# Patient Record
Sex: Female | Born: 1935 | Race: White | Hispanic: No | Smoking: Never smoker
Health system: Southern US, Community
[De-identification: ages and names within clinical notes are randomized; demographics above are authoritative.]

## PROBLEM LIST (undated history)

## (undated) DIAGNOSIS — J449 Chronic obstructive pulmonary disease, unspecified: Secondary | ICD-10-CM

## (undated) DIAGNOSIS — I509 Heart failure, unspecified: Secondary | ICD-10-CM

## (undated) DIAGNOSIS — I10 Essential (primary) hypertension: Secondary | ICD-10-CM

## (undated) HISTORY — PX: ABDOMINAL HYSTERECTOMY: SHX81

---

## 2005-11-21 ENCOUNTER — Ambulatory Visit: Payer: Self-pay

## 2006-12-06 ENCOUNTER — Ambulatory Visit: Payer: Self-pay

## 2008-03-19 ENCOUNTER — Ambulatory Visit: Payer: Self-pay

## 2008-04-04 ENCOUNTER — Ambulatory Visit: Payer: Self-pay

## 2008-04-09 ENCOUNTER — Ambulatory Visit: Payer: Self-pay

## 2009-07-06 ENCOUNTER — Ambulatory Visit: Payer: Self-pay

## 2010-03-03 ENCOUNTER — Inpatient Hospital Stay: Payer: Self-pay | Admitting: Internal Medicine

## 2010-08-30 ENCOUNTER — Ambulatory Visit: Payer: Self-pay | Admitting: Specialist

## 2010-09-21 ENCOUNTER — Ambulatory Visit: Payer: Self-pay

## 2011-10-27 ENCOUNTER — Ambulatory Visit: Payer: Self-pay

## 2014-02-22 ENCOUNTER — Inpatient Hospital Stay: Payer: Self-pay | Admitting: Internal Medicine

## 2014-02-22 LAB — URINALYSIS, COMPLETE
Bilirubin,UR: NEGATIVE
Glucose,UR: NEGATIVE mg/dL (ref 0–75)
Hyaline Cast: 13
Ketone: NEGATIVE
Nitrite: NEGATIVE
PH: 5 (ref 4.5–8.0)
PROTEIN: NEGATIVE
RBC,UR: <1 /HPF (ref 0–5)
Specific Gravity: 1.008 (ref 1.003–1.030)

## 2014-02-22 LAB — PROTIME-INR
INR: 1.2
Prothrombin Time: 14.9 secs — ABNORMAL HIGH (ref 11.5–14.7)

## 2014-02-22 LAB — COMPREHENSIVE METABOLIC PANEL
ALK PHOS: 114 U/L
ALT: 761 U/L — AB (ref 12–78)
ANION GAP: 8 (ref 7–16)
Albumin: 3.3 g/dL — ABNORMAL LOW (ref 3.4–5.0)
BUN: 61 mg/dL — ABNORMAL HIGH (ref 7–18)
Bilirubin,Total: 1.8 mg/dL — ABNORMAL HIGH (ref 0.2–1.0)
CALCIUM: 9.1 mg/dL (ref 8.5–10.1)
CHLORIDE: 97 mmol/L — AB (ref 98–107)
CO2: 30 mmol/L (ref 21–32)
CREATININE: 1.39 mg/dL — AB (ref 0.60–1.30)
EGFR (African American): 41 — ABNORMAL LOW
EGFR (Non-African Amer.): 35 — ABNORMAL LOW
GLUCOSE: 122 mg/dL — AB (ref 65–99)
OSMOLALITY: 289 (ref 275–301)
POTASSIUM: 4.7 mmol/L (ref 3.5–5.1)
SGOT(AST): 351 U/L — ABNORMAL HIGH (ref 15–37)
SODIUM: 135 mmol/L — AB (ref 136–145)
Total Protein: 6.1 g/dL — ABNORMAL LOW (ref 6.4–8.2)

## 2014-02-22 LAB — TROPONIN I
TROPONIN-I: 0.11 ng/mL — AB
Troponin-I: 0.05 ng/mL
Troponin-I: 0.12 ng/mL — ABNORMAL HIGH

## 2014-02-22 LAB — CBC
HCT: 44 % (ref 35.0–47.0)
HGB: 13.4 g/dL (ref 12.0–16.0)
MCH: 27.1 pg (ref 26.0–34.0)
MCHC: 30.6 g/dL — ABNORMAL LOW (ref 32.0–36.0)
MCV: 89 fL (ref 80–100)
Platelet: 185 10*3/uL (ref 150–440)
RBC: 4.97 10*6/uL (ref 3.80–5.20)
RDW: 14.9 % — AB (ref 11.5–14.5)
WBC: 12.1 10*3/uL — AB (ref 3.6–11.0)

## 2014-02-22 LAB — PRO B NATRIURETIC PEPTIDE: B-Type Natriuretic Peptide: 17300 pg/mL — ABNORMAL HIGH (ref 0–450)

## 2014-02-22 LAB — D-DIMER(ARMC): D-DIMER: 387 ng/mL

## 2014-02-23 LAB — CBC WITH DIFFERENTIAL/PLATELET
BASOS ABS: 0 10*3/uL (ref 0.0–0.1)
Basophil %: 0.1 %
EOS PCT: 0 %
Eosinophil #: 0 10*3/uL (ref 0.0–0.7)
HCT: 42 % (ref 35.0–47.0)
HGB: 12.7 g/dL (ref 12.0–16.0)
Lymphocyte #: 0.6 10*3/uL — ABNORMAL LOW (ref 1.0–3.6)
Lymphocyte %: 6 %
MCH: 26.5 pg (ref 26.0–34.0)
MCHC: 30.2 g/dL — AB (ref 32.0–36.0)
MCV: 88 fL (ref 80–100)
MONOS PCT: 6.4 %
Monocyte #: 0.6 x10 3/mm (ref 0.2–0.9)
Neutrophil #: 8.6 10*3/uL — ABNORMAL HIGH (ref 1.4–6.5)
Neutrophil %: 87.5 %
Platelet: 126 10*3/uL — ABNORMAL LOW (ref 150–440)
RBC: 4.77 10*6/uL (ref 3.80–5.20)
RDW: 14.3 % (ref 11.5–14.5)
WBC: 9.8 10*3/uL (ref 3.6–11.0)

## 2014-02-23 LAB — BASIC METABOLIC PANEL
ANION GAP: 3 — AB (ref 7–16)
Anion Gap: 6 — ABNORMAL LOW (ref 7–16)
BUN: 48 mg/dL — ABNORMAL HIGH (ref 7–18)
BUN: 44 mg/dL — ABNORMAL HIGH (ref 7–18)
CO2: 39 mmol/L — AB (ref 21–32)
CREATININE: 1.13 mg/dL (ref 0.60–1.30)
Calcium, Total: 9.2 mg/dL (ref 8.5–10.1)
Calcium, Total: 9 mg/dL (ref 8.5–10.1)
Chloride: 96 mmol/L — ABNORMAL LOW (ref 98–107)
Chloride: 96 mmol/L — ABNORMAL LOW (ref 98–107)
Co2: 40 mmol/L (ref 21–32)
Creatinine: 1.13 mg/dL (ref 0.60–1.30)
EGFR (African American): 52 — ABNORMAL LOW
EGFR (African American): 52 — ABNORMAL LOW
EGFR (Non-African Amer.): 45 — ABNORMAL LOW
GFR CALC NON AF AMER: 45 — AB
GLUCOSE: 88 mg/dL (ref 65–99)
Glucose: 113 mg/dL — ABNORMAL HIGH (ref 65–99)
Osmolality: 290 (ref 275–301)
Osmolality: 293 (ref 275–301)
POTASSIUM: 4.8 mmol/L (ref 3.5–5.1)
Potassium: 5.1 mmol/L (ref 3.5–5.1)
Sodium: 139 mmol/L (ref 136–145)
Sodium: 141 mmol/L (ref 136–145)

## 2014-02-23 LAB — LIPID PANEL
Cholesterol: 123 mg/dL (ref 0–200)
HDL: 16 mg/dL — AB (ref 40–60)
Ldl Cholesterol, Calc: 90 mg/dL (ref 0–100)
TRIGLYCERIDES: 87 mg/dL (ref 0–200)
VLDL CHOLESTEROL, CALC: 17 mg/dL (ref 5–40)

## 2014-02-23 LAB — TROPONIN I
TROPONIN-I: 0.12 ng/mL — AB
TROPONIN-I: 0.11 ng/mL — AB

## 2014-02-23 LAB — MAGNESIUM: MAGNESIUM: 1.5 mg/dL — AB

## 2014-02-24 LAB — COMPREHENSIVE METABOLIC PANEL
ALK PHOS: 81 U/L
Albumin: 3 g/dL — ABNORMAL LOW (ref 3.4–5.0)
Anion Gap: 3 — ABNORMAL LOW (ref 7–16)
BILIRUBIN TOTAL: 0.8 mg/dL (ref 0.2–1.0)
BUN: 43 mg/dL — ABNORMAL HIGH (ref 7–18)
CALCIUM: 9.5 mg/dL (ref 8.5–10.1)
Chloride: 93 mmol/L — ABNORMAL LOW (ref 98–107)
Co2: 43 mmol/L (ref 21–32)
Creatinine: 1.26 mg/dL (ref 0.60–1.30)
EGFR (African American): 46 — ABNORMAL LOW
EGFR (Non-African Amer.): 40 — ABNORMAL LOW
Glucose: 122 mg/dL — ABNORMAL HIGH (ref 65–99)
OSMOLALITY: 290 (ref 275–301)
Potassium: 4.4 mmol/L (ref 3.5–5.1)
SGOT(AST): 70 U/L — ABNORMAL HIGH (ref 15–37)
SGPT (ALT): 362 U/L — ABNORMAL HIGH (ref 12–78)
Sodium: 139 mmol/L (ref 136–145)
TOTAL PROTEIN: 5.4 g/dL — AB (ref 6.4–8.2)

## 2014-02-24 LAB — MAGNESIUM: MAGNESIUM: 1.8 mg/dL

## 2014-02-27 LAB — CULTURE, BLOOD (SINGLE)

## 2015-02-14 NOTE — Discharge Summary (Signed)
PATIENT NAME:  Priscilla Powers, MCWATTERS MR#:  [id number] DATE OF BIRTH:  [birth year]  DATE OF ADMISSION:  02/22/2014 DATE OF DISCHARGE:  02/24/2014  ADMISSION DIAGNOSES: Acute respiratory failure.   DISCHARGE DIAGNOSES:  1. Acute on chronic respiratory failure secondary to acute chronic obstructive pulmonary disease exacerbation and congestive heart failure.  2. Acute diastolic heart failure.  3. Elevated liver function tests.  4. Urinary tract infection.  5. Pneumonia.  6. Chronic obstructive pulmonary disease acute exacerbation on chronic.   CONSULTATIONS: None.   RADIOLOGICAL DATA:  A 2-D echocardiogram showed an EF of greater than 75% with restrictive pattern of LV diastolic feeling with moderately enlarged right ventricle, severely dilated right atrium, mild mitral valve regurgitation.   CT chest showed no evidence of pulmonary emboli.   LABORATORIES AT DISCHARGE: Discharge sodium 139, potassium 4.4, chloride 93, BUN 43, creatinine 1.26, glucose is 122, bilirubin 0.8, ALT 362, AST 70. Total protein 5.4, albumin 3.0, bilirubin 0.8.   HOSPITAL COURSE: A very pleasant 81 year old female presented with acute respiratory failure. For further details, please refer to the H and P.  1. Acute on chronic respiratory failure from right heart strain with diastolic dysfunction from underlying COPD and CHF exacerbation. The patient was placed on oxygen here. She had been on oxygen in the past at home. She was also placed on BiPAP here and failed BiPAP therapy for her COPD exacerbation. She was also started on antibiotics for a possible underlying pneumonia as per the chest x-ray and chest CT that was performed. She has greatly improved and will need oxygen at discharge.  2. Acute diastolic heart failure.  The patient was started on IV Lasix. She will continue with Lasix as an outpatient; however, it should be noted that we did diurese her and she has a metabolic alkalosis from the Lasix and we will hold Lasix for  a few days and restart this on Friday. Her echo showed hyperdynamic EF with a restrictive pattern. The patient needs an outpatient followup for her blood pressure and COPD. Elevated LFTs, which had improved. These LFTs are secondary to acute CHF exacerbation.  3. Urinary tract infection. The patient is on Levaquin.  4. Pneumonia, community-acquired pneumonia. The patient was on Levaquin.  5. COPD, acute exacerbation. The patient was placed on IV steroids, weaned to p.o. steroids and oxygen. She was on oxygen years ago but for a short time. The patient has acute exacerbation of COPD.  BiPAP not effective in hospital. Patient required noninvasive ventilator at home; therefore, we will recommend Trilogy as an outpatient.   DISCHARGE MEDICATIONS:  1. Inderal 60 mg daily.  2. Acetaminophen 500 mg 2 tablets q.6 hours.  3. Omeprazole 20 mg daily.  4. B12 3000 mcg daily.  5. Lasix 20 mg b.i.d. Please restart on 02/28/2014.  6. Prednisone taper starting at 50 mg, taper by 10 mg every 3 days.  7. Levaquin 750 mg q. 48 hours x 6 hours.  8. Advair 250/50 b.i.d.  9. Tiotropium 18 mcg daily.   DISCHARGE HOME HEALTH: With physical therapy, nurse, and nurse aide.   DISCHARGE DIET: Low sodium.   DISCHARGE OXYGEN: Two liters. The patient will also be discharged with a Trilogy noninvasive machine.   DISCHARGE ACTIVITY: No exertion or heavy lifting.   DISCHARGE FOLLOWUP:  Patient will follow up with Dr. Meredeth Ide in 1 week and Dr. Adrian Blackwater in 1 week and Dr. Vear Clock in 1 week.   TIME SPENT: Approximately 38 minutes on this discharge.  The patient is medically stable for discharge. Plan of care was discussed with the family.    ____________________________ Janyth ContesSital P. Juliene PinaMody, MD spm:dd D: 02/24/2014 13:58:00 ET T: 02/24/2014 23:31:50 ET JOB#: [id number]  cc: Laurier NancyShaukat A. Khan, MD Marcine Matarharles W. Phillips Jr., MD Laiba Fuerte P. Juliene PinaMody, MD, <Dictator> Dr. Merry LoftyFleming     Hayly Litsey P Pavel Gadd MD ELECTRONICALLY SIGNED 02/25/2014  13:13

## 2015-02-14 NOTE — H&P (Signed)
PATIENT NAME:  Priscilla Powers, Priscilla S MR#:  [id number] DATE OF BIRTH:  1930-04-30  DATE OF ADMISSION:  02/22/2014  PRIMARY CARE PHYSICIAN:  Dr. Vear ClockPhillips.   CHIEF COMPLAINT:  Leg swelling and shortness of breath.   HISTORY OF PRESENT ILLNESS:  This is an 81 year old female who has a history of hypertension, B12 deficiency, COPD.  She states that her legs have been more swollen for about a week.  Recently increased the dosage of the fluid pill.  She has been falling asleep a lot.  No energy.  Normally she walks with a walker.  She has had some shortness of breath and trouble lying flat without being short of breath.  She has had no appetite and she is very nauseated.  In the ER, she had a pulse ox of 76% on room air.  An ABG showed CO2 retention.  The patient was placed on BiPAP.  She had an elevated BNP.  Hospitalist services were contacted for further evaluation.   PAST MEDICAL HISTORY:  Hypertension, B12 deficiency, COPD, gastroesophageal reflux disease.   PAST SURGICAL HISTORY:  Hysterectomy, hip surgery, knee surgery and a breast gland biopsy.   ALLERGIES:  PENICILLIN.   MEDICATIONS:  As per prescription writer include acetaminophen 500 mg 2 tablets every six hours, B12 3000 mcg once a day, Inderal 60 mg daily, Lasix 20 mg twice a day, omeprazole 20 mg daily.   SOCIAL HISTORY:  Quit smoking 30 years ago.  Used to work in a [employer].  Lives with her daughter.  No alcohol or drug use.   FAMILY HISTORY:  Mother died of car accident.  Father died of lung cancer.   REVIEW OF SYSTEMS:  CONSTITUTIONAL:  Positive for chills.  Positive for cold feeling.  Positive for weakness.  No fever.  Poor appetite.  EYES:  She does wear reading glasses.  EARS, NOSE, MOUTH AND THROAT:  Decreased hearing.  Positive for runny nose.  Positive for dysphagia to solids.  CARDIOVASCULAR:  No chest pain.  No palpitations.  RESPIRATORY:  Positive for shortness of breath.  Positive for cough, clear phlegm.  No hemoptysis.   GASTROINTESTINAL:  Positive for nausea.  No vomiting.  Occasional abdominal pain.  On and off diarrhea.  No bright red blood per rectum.  No melena.  GENITOURINARY:  No burning on urination or hematuria.  MUSCULOSKELETAL:  Positive for joint pain.  INTEGUMENT:  No rashes or eruptions.  NEUROLOGIC:  Falling asleep easily.  No passing out.  PSYCHIATRIC:  No anxiety or depression.  ENDOCRINE:  No thyroid problems.  HEMATOLOGIC AND LYMPHATIC:  No anemia.   PHYSICAL EXAMINATION: VITAL SIGNS:  Temperature 95.9, pulse 65, respirations 18, blood pressure 113/53.  Pulse ox 76% on room air.  GENERAL:  Slight respiratory distress, using accessory muscles.  EYES:  Conjunctivae and lids normal.  Pupils equal, round, and reactive to light.  Extraocular muscles intact.  No nystagmus.   EARS, NOSE, MOUTH AND THROAT:  Tympanic membranes:  No erythema.  Nasal mucosa:  No erythema.  Throat:  No erythema.  No exudate seen.  Lips and gums:  No lesions.  NECK:  No JVD.  No bruits.  No lymphadenopathy.  No thyromegaly.  No thyroid nodules palpated.  RESPIRATORY:  Positive use of accessory muscles to breathe.  Positive rales bilateral bases.  Poor air entry bilaterally.  CARDIOVASCULAR:  S1 and S2, bradycardic.  No gallops, rubs, or murmurs heard.  Carotid upstroke 2+ bilaterally.  No bruits.  Dorsalis pedis  pulses are 1+ bilaterally, 3+ edema bilateral lower extremity.  ABDOMEN:  Soft, nontender.  No organomegaly/splenomegaly.  Normoactive bowel sounds.  No masses felt.  LYMPHATIC:  No lymph nodes in the neck.  MUSCULOSKELETAL:  3+ edema.  No cyanosis on BiPAP.  SKIN:  Purplish discoloration of all of her toes on her right foot and up the foot, cool feeling also.  NEUROLOGIC:  Cranial nerves II through XII grossly intact.  Deep tendon reflexes, 1+ bilateral lower extremity.  PSYCHIATRIC:  The patient is alert and oriented to person and place.   LABORATORY AND RADIOLOGICAL DATA:  Chest x-ray showed small area of  opacity of the left lung base, not well evaluated due to superimposed cardiac silhouette.  ABG showed a pH of 7.24, pCO2 of 88, pO2 155, bicarb 37.7, O2 saturation 99.4; that was on 28% oxygen.  Bilateral lower extremity ultrasound negative.  BNP 17,300.  Glucose 122, BUN 61, creatinine 1.39, sodium 135, potassium 4.7, chloride 97, CO2 30, calcium 9.1, total bilirubin 1.8, ALT 761, AST 351, albumin 3.3.  White blood cell count 12.1, hemoglobin and hematocrit 13.4 and 44, platelet count of 185.  INR 1.2.  Troponin negative.  D-dimer negative.   EKG:  Normal sinus rhythm, 63 beats per minute, left axis deviation, left atrial enlargement, pulmonary disease pattern, nonspecific ST-T wave changes.   ASSESSMENT AND PLAN: 1.  Acute respiratory failure, hypoxic and hypercarbic.  The patient currently on BiPAP in order to oxygenate and blow off CO2.  Will be a candidate for Trilogy as outpatient.  2.  Acute congestive heart failure with anasarca, likely systolic in nature with cardiomegaly seen on chest x-ray.  Intravenous Lasix given in the Emergency Room.  We will continue that on a daily basis.  We will switch the Inderal over to metoprolol.  Obtain an echocardiogram, likely will need an ACE inhibitor.  3.  Hypertension.  Blood pressure currently stable.  4.  Chronic obstructive pulmonary disease.  Looking back at old records, back in 2011 was sent home with home oxygen.  Currently not on oxygen at home.  We will have to check to see if a candidate for oxygen at home.  We will also treat for chronic obstructive pulmonary disease exacerbation with Levaquin and Solu-Medrol and DuoNeb nebulizer solution.  We will start on Spiriva and Advair.  5.  Increased liver function tests.  Could be secondary to congestive heart failure.  We will obtain a sonogram of the liver and check a hepatitis profile.  6.  B12 deficiency.  Continue B12.  7.  Gastroesophageal reflux disease, on a proton pump inhibitor.   Time spent on  admission 55 minutes.   CODE STATUS:  THE PATIENT IS A DO NOT RESUSCITATE.    ____________________________ Herschell Dimes. Renae Gloss, MD rjw:ea D: 02/22/2014 13:46:06 ET T: 02/22/2014 15:47:24 ET JOB#: [id number]  cc: Herschell Dimes. Renae Gloss, MD, <Dictator> Marcine Matar., MD Salley Scarlet MD ELECTRONICALLY SIGNED 02/22/2014 17:15

## 2015-05-12 ENCOUNTER — Encounter: Payer: Self-pay | Admitting: *Deleted

## 2015-05-12 DIAGNOSIS — N390 Urinary tract infection, site not specified: Secondary | ICD-10-CM | POA: Insufficient documentation

## 2015-05-12 DIAGNOSIS — I10 Essential (primary) hypertension: Secondary | ICD-10-CM | POA: Insufficient documentation

## 2015-05-12 DIAGNOSIS — M545 Low back pain: Secondary | ICD-10-CM | POA: Diagnosis not present

## 2015-05-12 DIAGNOSIS — Z880 Allergy status to penicillin: Secondary | ICD-10-CM | POA: Diagnosis not present

## 2015-05-12 NOTE — ED Notes (Signed)
Pt unable to void at this time. 

## 2015-05-12 NOTE — ED Notes (Addendum)
Pt to triage via wheelchair.  Pt has lower back pain.  Sx for 4 days.  No dysuria.  No known injury.  Pt states she injured her back 5 years ago and did not have surgery.  Pt states she chose to take pain medicine instead of surgery.  Pt is taking extra strength tyelenol for pain without relief now  Pt ambulates with a walker and states today she is unable to ambulate.

## 2015-05-13 ENCOUNTER — Emergency Department: Payer: Medicare Other

## 2015-05-13 ENCOUNTER — Emergency Department
Admission: EM | Admit: 2015-05-13 | Discharge: 2015-05-13 | Disposition: A | Discharge status: Home / Self Care | Payer: Medicare Other | Attending: Emergency Medicine | Admitting: Emergency Medicine

## 2015-05-13 DIAGNOSIS — M545 Low back pain, unspecified: Secondary | ICD-10-CM

## 2015-05-13 DIAGNOSIS — N390 Urinary tract infection, site not specified: Secondary | ICD-10-CM

## 2015-05-13 LAB — URINALYSIS COMPLETE WITH MICROSCOPIC (ARMC ONLY)
Bilirubin Urine: NEGATIVE
GLUCOSE, UA: NEGATIVE mg/dL
Hgb urine dipstick: NEGATIVE
Ketones, ur: NEGATIVE mg/dL
Nitrite: NEGATIVE
PH: 5 (ref 5.0–8.0)
Protein, ur: NEGATIVE mg/dL
Specific Gravity, Urine: 1.014 (ref 1.005–1.030)

## 2015-05-13 LAB — CBC WITH DIFFERENTIAL/PLATELET
BASOS ABS: 0 10*3/uL (ref 0–0.1)
Basophils Relative: 1 %
EOS PCT: 2 %
Eosinophils Absolute: 0.2 10*3/uL (ref 0–0.7)
HEMATOCRIT: 34.5 % — AB (ref 35.0–47.0)
HEMOGLOBIN: 11.4 g/dL — AB (ref 12.0–16.0)
Lymphocytes Relative: 16 %
Lymphs Abs: 1.2 10*3/uL (ref 1.0–3.6)
MCH: 29.9 pg (ref 26.0–34.0)
MCHC: 33 g/dL (ref 32.0–36.0)
MCV: 90.6 fL (ref 80.0–100.0)
Monocytes Absolute: 0.9 10*3/uL (ref 0.2–0.9)
Monocytes Relative: 11 %
Neutro Abs: 5.6 10*3/uL (ref 1.4–6.5)
Neutrophils Relative %: 70 %
Platelets: 170 10*3/uL (ref 150–440)
RBC: 3.81 MIL/uL (ref 3.80–5.20)
RDW: 15.5 % — AB (ref 11.5–14.5)
WBC: 7.9 10*3/uL (ref 3.6–11.0)

## 2015-05-13 LAB — BASIC METABOLIC PANEL
Anion gap: 5 (ref 5–15)
BUN: 26 mg/dL — ABNORMAL HIGH (ref 6–20)
CO2: 31 mmol/L (ref 22–32)
CREATININE: 1.37 mg/dL — AB (ref 0.44–1.00)
Calcium: 10 mg/dL (ref 8.9–10.3)
Chloride: 104 mmol/L (ref 101–111)
GFR calc Af Amer: 40 mL/min — ABNORMAL LOW (ref >60)
GFR calc non Af Amer: 35 mL/min — ABNORMAL LOW (ref >60)
Glucose, Bld: 114 mg/dL — ABNORMAL HIGH (ref 65–99)
POTASSIUM: 4.2 mmol/L (ref 3.5–5.1)
Sodium: 140 mmol/L (ref 135–145)

## 2015-05-13 MED ORDER — TRAMADOL HCL 50 MG PO TABS
25.0000 mg | ORAL_TABLET | Freq: Once | ORAL | Status: AC
  Administered 2015-05-13: 25 mg via ORAL
  Filled 2015-05-13: qty 1

## 2015-05-13 MED ORDER — TRAMADOL HCL 50 MG PO TABS: 25.0000 mg | ORAL_TABLET | Freq: Four times a day (QID) | ORAL | Status: DC | PRN

## 2015-05-13 MED ORDER — NITROFURANTOIN MONOHYD MACRO 100 MG PO CAPS
100.0000 mg | ORAL_CAPSULE | Freq: Once | ORAL | Status: AC
  Administered 2015-05-13: 100 mg via ORAL
  Filled 2015-05-13: qty 1

## 2015-05-13 MED ORDER — NITROFURANTOIN MONOHYD MACRO 100 MG PO CAPS: 100.0000 mg | ORAL_CAPSULE | Freq: Two times a day (BID) | ORAL | Status: DC

## 2015-05-13 MED ORDER — KETOROLAC TROMETHAMINE 30 MG/ML IJ SOLN
30.0000 mg | Freq: Once | INTRAMUSCULAR | Status: AC
  Administered 2015-05-13: 30 mg via INTRAVENOUS
  Filled 2015-05-13: qty 1

## 2015-05-13 MED ORDER — HYDROCODONE-ACETAMINOPHEN 5-325 MG PO TABS: 1.0000 | ORAL_TABLET | Freq: Once | ORAL | Status: DC

## 2015-05-13 NOTE — ED Notes (Signed)
Ambulation trial performed per MD request. Patient able to ambulate around the room several times with the use of her walker. Urinae sample collected and sent while patient was OOB. No verbalized needs at this time. Will continue to monitor.

## 2015-05-13 NOTE — ED Provider Notes (Signed)
[facility] Emergency Department Provider Note  ____________________________________________  Time seen: Approximately 12:40 AM  I have reviewed the triage vital signs and the nursing notes.   HISTORY  Chief Complaint Back Pain    HPI Priscilla Powers is a 81 y.o. female who presents to the ED from home with a chief complaint of low back pain 4 days. Patient denies trauma, fall or injury. Patient describes aching to lower back which increases with movement. Unable to ambulate with her walker today secondary to pain. Patient has been taking extra strength Tylenol without relief. States she injured her back 5 years ago; had an MRI and was offered surgery. Patient chose nonsurgical treatment and has no had any issues with her back since. Denies fever, chills, chest pain, shortness of breath, cough, abdominal pain, vomiting, diarrhea, dysuria, weakness, numbness, tingling, bladder or bowel incontinence. Patient complains of urinary frequency but was started on Lasix last week for lower leg swelling.    PAST MEDICAL HISTORY: Hypertension, B12 deficiency, COPD, gastroesophageal reflux disease.   PAST SURGICAL HISTORY: Hysterectomy, hip surgery, knee surgery and a breast gland biopsy.   There are no active problems to display for this patient.    No current outpatient prescriptions on file.  Allergies Penicillins  FAMILY HISTORY: Mother died of car accident. Father died of lung cancer.   Social History History  Substance Use Topics  . Smoking status: Never Smoker   . Smokeless tobacco: Not on file  . Alcohol Use: No  Former smoker  Review of Systems Constitutional: No fever/chills Eyes: No visual changes. ENT: No sore throat. Cardiovascular: Denies chest pain. Respiratory: Denies shortness of breath. Gastrointestinal: No abdominal pain.  No nausea, no vomiting.  No diarrhea.  No constipation. Genitourinary: Positive for frequency.Negative for  dysuria. Musculoskeletal: Positive for back pain. Skin: Negative for rash. Neurological: Negative for headaches, focal weakness or numbness.  10-point ROS otherwise negative.  ____________________________________________   PHYSICAL EXAM:  VITAL SIGNS: ED Triage Vitals  Enc Vitals Group     BP 05/12/15 2200 112/59 mmHg     Pulse Rate 05/12/15 2200 83     Resp 05/12/15 2200 20     Temp 05/12/15 2200 97.9 F (36.6 C)     Temp Source 05/12/15 2200 Oral     SpO2 05/12/15 2200 98 %     Weight 05/12/15 2200 137 lb (62.143 kg)     Height 05/12/15 2200 5\' 5"  (1.651 m)     Head Cir --      Peak Flow --      Pain Score 05/12/15 2202 7     Pain Loc --      Pain Edu? --      Excl. in GC? --     Constitutional: Alert and oriented. Well appearing and in no acute distress. Eyes: Conjunctivae are normal. PERRL. EOMI. Head: Atraumatic. Nose: No congestion/rhinnorhea. Mouth/Throat: Mucous membranes are moist.  Oropharynx non-erythematous. Neck: No stridor.   Cardiovascular: Normal rate, regular rhythm. Grossly normal heart sounds.  Good peripheral circulation. Respiratory: Normal respiratory effort.  No retractions. Lungs CTAB. Gastrointestinal: Soft and nontender. No distention. No abdominal bruits. No CVA tenderness. Musculoskeletal: Mild tenderness to palpation lumbar spine. Limited ROM secondary to pain. No lower extremity tenderness.1+ pitting BLE edema.  No joint effusions. Neurologic:  Normal speech and language. No gross focal neurologic deficits are appreciated.  Skin:  Skin is warm, dry and intact. No rash noted. Psychiatric: Mood and affect are normal. Speech  and behavior are normal.  ____________________________________________   LABS (all labs ordered are listed, but only abnormal results are displayed)  Labs Reviewed  CBC WITH DIFFERENTIAL/PLATELET - Abnormal; Notable for the following:    Hemoglobin 11.4 (*)    HCT 34.5 (*)    RDW 15.5 (*)    All other components  within normal limits  BASIC METABOLIC PANEL - Abnormal; Notable for the following:    Glucose, Bld 114 (*)    BUN 26 (*)    Creatinine, Ser 1.37 (*)    GFR calc non Af Amer 35 (*)    GFR calc Af Amer 40 (*)    All other components within normal limits  URINALYSIS COMPLETEWITH MICROSCOPIC ([facility] ONLY) - Abnormal; Notable for the following:    Color, Urine YELLOW (*)    APPearance CLEAR (*)    Leukocytes, UA 1+ (*)    Bacteria, UA RARE (*)    Squamous Epithelial / LPF 0-5 (*)    All other components within normal limits   ____________________________________________  EKG  None  ____________________________________________  RADIOLOGY  Lumbar spine x-rays (viewed by me, interpreted by Dr. Cherly Hensen): 1. No evidence of acute fracture or subluxation along the lumbar spine. 2. Significant chronic compression deformity of vertebral body L1, worsened from prior studies. ____________________________________________   PROCEDURES  Procedure(s) performed: None  Critical Care performed: No  ____________________________________________   INITIAL IMPRESSION / ASSESSMENT AND PLAN / ED COURSE  Pertinent labs & imaging results that were available during my care of the patient were reviewed by me and considered in my medical decision making (see chart for details).  81 year old female with nontraumatic lumbar back pain. Will obtain x-ray, urinalysis, check screening laboratory work, IV analgesia and reassess.  ----------------------------------------- 4:11 AM on 05/13/2015 -----------------------------------------  Patient ambulated well with a walker. Discussed with patient and family member; both are comfortable with patient going home with mild analgesia. Will follow-up with orthopedics. Strict return precautions given. Both verbalize understanding and agree with plan of care. ____________________________________________   FINAL CLINICAL IMPRESSION(S) / ED DIAGNOSES  Final  diagnoses:  Midline low back pain without sciatica  UTI (lower urinary tract infection)      Irean Hong, MD 05/13/15 [phone]

## 2015-05-13 NOTE — ED Notes (Signed)
Patient transported to X-ray 

## 2015-05-13 NOTE — Discharge Instructions (Signed)
1. Take antibiotics as prescribed (Macrobid 100 mg twice daily 5 days). 2. Take one half tramadol tablet every 6 hours as needed for back pain (#15). 3. Return to the ER for worsening symptoms, fever, persistent vomiting, weakness in legs, bladder or bowel incontinence or other concerns.  Back Pain, Adult Low back pain is very common. About 1 in 5 people have back pain.The cause of low back pain is rarely dangerous. The pain often gets better over time.About half of people with a sudden onset of back pain feel better in just 2 weeks. About 8 in 10 people feel better by 6 weeks.  CAUSES Some common causes of back pain include:  Strain of the muscles or ligaments supporting the spine.  Wear and tear (degeneration) of the spinal discs.  Arthritis.  Direct injury to the back. DIAGNOSIS Most of the time, the direct cause of low back pain is not known.However, back pain can be treated effectively even when the exact cause of the pain is unknown.Answering your caregiver's questions about your overall health and symptoms is one of the most accurate ways to make sure the cause of your pain is not dangerous. If your caregiver needs more information, he or she may order lab work or imaging tests (X-rays or MRIs).However, even if imaging tests show changes in your back, this usually does not require surgery. HOME CARE INSTRUCTIONS For many people, back pain returns.Since low back pain is rarely dangerous, it is often a condition that people can learn to [facility] their own.   Remain active. It is stressful on the back to sit or stand in one place. Do not sit, drive, or stand in one place for more than 30 minutes at a time. Take short walks on level surfaces as soon as pain allows.Try to increase the length of time you walk each day.  Do not stay in bed.Resting more than 1 or 2 days can delay your recovery.  Do not avoid exercise or work.Your body is made to move.It is not dangerous to be  active, even though your back may hurt.Your back will likely heal faster if you return to being active before your pain is gone.  Pay attention to your body when you bend and lift. Many people have less discomfortwhen lifting if they bend their knees, keep the load close to their bodies,and avoid twisting. Often, the most comfortable positions are those that put less stress on your recovering back.  Find a comfortable position to sleep. Use a firm mattress and lie on your side with your knees slightly bent. If you lie on your back, put a pillow under your knees.  Only take over-the-counter or prescription medicines as directed by your caregiver. Over-the-counter medicines to reduce pain and inflammation are often the most helpful.Your caregiver may prescribe muscle relaxant drugs.These medicines help dull your pain so you can more quickly return to your normal activities and healthy exercise.  Put ice on the injured area.  Put ice in a plastic bag.  Place a towel between your skin and the bag.  Leave the ice on for 15-20 minutes, 03-04 times a day for the first 2 to 3 days. After that, ice and heat may be alternated to reduce pain and spasms.  Ask your caregiver about trying back exercises and gentle massage. This may be of some benefit.  Avoid feeling anxious or stressed.Stress increases muscle tension and can worsen back pain.It is important to recognize when you are anxious or stressed and  learn ways to manage it.Exercise is a great option. SEEK MEDICAL CARE IF:  You have pain that is not relieved with rest or medicine.  You have pain that does not improve in 1 week.  You have new symptoms.  You are generally not feeling well. SEEK IMMEDIATE MEDICAL CARE IF:   You have pain that radiates from your back into your legs.  You develop new bowel or bladder control problems.  You have unusual weakness or numbness in your arms or legs.  You develop nausea or vomiting.  You  develop abdominal pain.  You feel faint. Document Released: 10/10/2005 Document Revised: 04/10/2012 Document Reviewed: 02/11/2014 [facility] Patient Information 2015 [place], [place]. This information is not intended to replace advice given to you by your health care provider. Make sure you discuss any questions you have with your health care provider.  Urinary Tract Infection Urinary tract infections (UTIs) can develop anywhere along your urinary tract. Your urinary tract is your body's drainage system for removing wastes and extra water. Your urinary tract includes two kidneys, two ureters, a bladder, and a urethra. Your kidneys are a pair of bean-shaped organs. Each kidney is about the size of your fist. They are located below your ribs, one on each side of your spine. CAUSES Infections are caused by microbes, which are microscopic organisms, including fungi, viruses, and bacteria. These organisms are so small that they can only be seen through a microscope. Bacteria are the microbes that most commonly cause UTIs. SYMPTOMS  Symptoms of UTIs may vary by age and gender of the patient and by the location of the infection. Symptoms in young women typically include a frequent and intense urge to urinate and a painful, burning feeling in the bladder or urethra during urination. Older women and men are more likely to be tired, shaky, and weak and have muscle aches and abdominal pain. A fever may mean the infection is in your kidneys. Other symptoms of a kidney infection include pain in your back or sides below the ribs, nausea, and vomiting. DIAGNOSIS To diagnose a UTI, your caregiver will ask you about your symptoms. Your caregiver also will ask to provide a urine sample. The urine sample will be tested for bacteria and white blood cells. White blood cells are made by your body to help fight infection. TREATMENT  Typically, UTIs can be treated with medication. Because most UTIs are caused by a bacterial  infection, they usually can be treated with the use of antibiotics. The choice of antibiotic and length of treatment depend on your symptoms and the type of bacteria causing your infection. HOME CARE INSTRUCTIONS  If you were prescribed antibiotics, take them exactly as your caregiver instructs you. Finish the medication even if you feel better after you have only taken some of the medication.  Drink enough water and fluids to keep your urine clear or pale yellow.  Avoid caffeine, tea, and carbonated beverages. They tend to irritate your bladder.  Empty your bladder often. Avoid holding urine for long periods of time.  Empty your bladder before and after sexual intercourse.  After a bowel movement, women should cleanse from front to back. Use each tissue only once. SEEK MEDICAL CARE IF:   You have back pain.  You develop a fever.  Your symptoms do not begin to resolve within 3 days. SEEK IMMEDIATE MEDICAL CARE IF:   You have severe back pain or lower abdominal pain.  You develop chills.  You have nausea or vomiting.  You have continued burning or discomfort with urination. MAKE SURE YOU:   Understand these instructions.  Will watch your condition.  Will get help right away if you are not doing well or get worse. Document Released: 07/20/2005 Document Revised: 04/10/2012 Document Reviewed: 11/18/2011 [facility] Patient Information 2015 [place], [place]. This information is not intended to replace advice given to you by your health care provider. Make sure you discuss any questions you have with your health care provider.

## 2015-05-15 ENCOUNTER — Emergency Department
Admission: EM | Admit: 2015-05-15 | Discharge: 2015-05-15 | Disposition: A | Discharge status: Home / Self Care | Payer: Medicare Other | Attending: Emergency Medicine | Admitting: Emergency Medicine

## 2015-05-15 DIAGNOSIS — M545 Low back pain: Secondary | ICD-10-CM | POA: Insufficient documentation

## 2015-05-15 DIAGNOSIS — Z8781 Personal history of (healed) traumatic fracture: Secondary | ICD-10-CM | POA: Insufficient documentation

## 2015-05-15 DIAGNOSIS — R531 Weakness: Secondary | ICD-10-CM | POA: Insufficient documentation

## 2015-05-15 DIAGNOSIS — F419 Anxiety disorder, unspecified: Secondary | ICD-10-CM | POA: Insufficient documentation

## 2015-05-15 DIAGNOSIS — Z880 Allergy status to penicillin: Secondary | ICD-10-CM | POA: Diagnosis not present

## 2015-05-15 DIAGNOSIS — M549 Dorsalgia, unspecified: Secondary | ICD-10-CM

## 2015-05-15 DIAGNOSIS — G8929 Other chronic pain: Secondary | ICD-10-CM | POA: Diagnosis not present

## 2015-05-15 DIAGNOSIS — Z79899 Other long term (current) drug therapy: Secondary | ICD-10-CM | POA: Diagnosis not present

## 2015-05-15 LAB — CBC
HEMATOCRIT: 33.5 % — AB (ref 35.0–47.0)
HEMOGLOBIN: 11.2 g/dL — AB (ref 12.0–16.0)
MCH: 30.1 pg (ref 26.0–34.0)
MCHC: 33.3 g/dL (ref 32.0–36.0)
MCV: 90.4 fL (ref 80.0–100.0)
Platelets: 161 10*3/uL (ref 150–440)
RBC: 3.71 MIL/uL — AB (ref 3.80–5.20)
RDW: 15.2 % — ABNORMAL HIGH (ref 11.5–14.5)
WBC: 10 10*3/uL (ref 3.6–11.0)

## 2015-05-15 LAB — COMPREHENSIVE METABOLIC PANEL
ALBUMIN: 3.6 g/dL (ref 3.5–5.0)
ALT: 12 U/L — ABNORMAL LOW (ref 14–54)
AST: 21 U/L (ref 15–41)
Alkaline Phosphatase: 93 U/L (ref 38–126)
Anion gap: 6 (ref 5–15)
BUN: 25 mg/dL — AB (ref 6–20)
CO2: 30 mmol/L (ref 22–32)
CREATININE: 1.24 mg/dL — AB (ref 0.44–1.00)
Calcium: 10.5 mg/dL — ABNORMAL HIGH (ref 8.9–10.3)
Chloride: 105 mmol/L (ref 101–111)
GFR calc Af Amer: 45 mL/min — ABNORMAL LOW (ref >60)
GFR, EST NON AFRICAN AMERICAN: 39 mL/min — AB (ref >60)
Glucose, Bld: 146 mg/dL — ABNORMAL HIGH (ref 65–99)
Potassium: 4.4 mmol/L (ref 3.5–5.1)
Sodium: 141 mmol/L (ref 135–145)
Total Bilirubin: 1.3 mg/dL — ABNORMAL HIGH (ref 0.3–1.2)
Total Protein: 6.2 g/dL — ABNORMAL LOW (ref 6.5–8.1)

## 2015-05-15 MED ORDER — OXYCODONE-ACETAMINOPHEN 5-325 MG PO TABS: 1.0000 | ORAL_TABLET | Freq: Three times a day (TID) | ORAL | Status: DC | PRN

## 2015-05-15 MED ORDER — OXYCODONE-ACETAMINOPHEN 5-325 MG PO TABS
1.0000 | ORAL_TABLET | Freq: Once | ORAL | Status: AC
  Administered 2015-05-15: 1 via ORAL
  Filled 2015-05-15: qty 1

## 2015-05-15 NOTE — ED Notes (Signed)
Pt son in law at bedside; states that pt is unable to function while at home. States that pt needs to know alternatives and ways to help her at home. Pt lives with son in law and daughter but they have jobs and are unable to provide 24/7 care for patient.

## 2015-05-15 NOTE — Discharge Instructions (Signed)

## 2015-05-15 NOTE — ED Notes (Signed)
Pt here with family states she has a hx of compression fx and recently having increased pain nausea and is not able to get up and move around without a lot of assistance, states she is taking pain medication without relief.Marland Kitchen

## 2015-05-15 NOTE — ED Provider Notes (Signed)
[facility] Emergency Department Provider Note  ____________________________________________  Time seen: On arrival  I have reviewed the triage vital signs and the nursing notes.   HISTORY  Chief Complaint Back Pain and Weakness    HPI Priscilla Powers is a 81 y.o. female with a history of an L1 compression fracture who presents with low back pain. She was seen in the emergency department 2 days ago where x-rays demonstrated worsening of her chronic L1 compression fracture. She was given pain medication and was able to ambulate with her walker. Over the last 2 days she has not had relief of her pain despite taking Vicodin intermittently. She is having difficulty getting around. She denies focal weakness or numbness. No saddle anesthesia. No fevers no chills     No past medical history on file.  There are no active problems to display for this patient.   No past surgical history on file.  Current Outpatient Rx  Name  Route  Sig  Dispense  Refill  . nitrofurantoin, macrocrystal-monohydrate, (MACROBID) 100 MG capsule   Oral   Take 1 capsule (100 mg total) by mouth 2 (two) times daily.   10 capsule   0   . traMADol (ULTRAM) 50 MG tablet   Oral   Take 0.5 tablets (25 mg total) by mouth every 6 (six) hours as needed for moderate pain.   15 tablet   0     Allergies Penicillins  No family history on file.  Social History History  Substance Use Topics  . Smoking status: Never Smoker   . Smokeless tobacco: Not on file  . Alcohol Use: No    Review of Systems  Constitutional: Negative for fever. Eyes: Negative for visual changes. ENT: Negative for sore throat Cardiovascular: Negative for chest pain. Respiratory: Negative for shortness of breath. Gastrointestinal: Negative for abdominal pain, vomiting and diarrhea. Genitourinary: Negative for dysuria. Musculoskeletal: Positive for back pain Skin: Negative for rash. Neurological: Negative for  headaches or focal weakness Psychiatric:Mild anxiety  10-point ROS otherwise negative.  ____________________________________________   PHYSICAL EXAM:  VITAL SIGNS: ED Triage Vitals  Enc Vitals Group     BP 05/15/15 0858 139/63 mmHg     Pulse Rate 05/15/15 0858 88     Resp 05/15/15 0858 24     Temp 05/15/15 0858 98 F (36.7 C)     Temp Source 05/15/15 0858 Oral     SpO2 05/15/15 0858 90 %     Weight 05/15/15 0858 136 lb (61.689 kg)     Height 05/15/15 0858  (1.651 m)     Head Cir --      Peak Flow --      Pain Score 05/15/15 0859 10     Pain Loc --      Pain Edu? --      Excl. in GC? --      Constitutional: Alert and oriented. Well appearing and in no distress. Eyes: Conjunctivae are normal.  ENT   Head: Normocephalic and atraumatic.   Mouth/Throat: Mucous membranes are moist. Cardiovascular: Normal rate, regular rhythm. Normal and symmetric distal pulses are present in all extremities. No murmurs, rubs, or gallops. Respiratory: Normal respiratory effort without tachypnea nor retractions. Breath sounds are clear and equal bilaterally.  Gastrointestinal: Soft and non-tender in all quadrants. No distention. There is no CVA tenderness. Genitourinary: deferred Musculoskeletal: Nontender with normal range of motion in all extremities. No lower extremity tenderness nor edema. No vertebral point tenderness tolpation  Neurologic:  Normal speech and language. No known neurological deficits are appreciated. Normal reflexes in the lower extremity is Skin:  Skin is warm, dry. To left lower back patient has mild erythema she attributes to a heating pad Psychiatric: Mood and affect are normal. Patient exhibits appropriate insight and judgment.  ____________________________________________    LABS (pertinent positives/negatives)  Labs Reviewed - No data to  display  ____________________________________________   EKG  None  ____________________________________________    RADIOLOGY I have personally reviewed any xrays that were ordered on this patient: None  ____________________________________________   PROCEDURES  Procedure(s) performed: none  Critical Care performed: none  ____________________________________________   INITIAL IMPRESSION / ASSESSMENT AND PLAN / ED COURSE  Pertinent labs & imaging results that were available during my care of the patient were reviewed by me and considered in my medical decision making (see chart for details).  We will give patient by mouth analgesic here to see if we get her pain controlled.  1 percocet significantly improved patients pain and she is moving around easily now. Her labs are unremarkable. I see no reason for admission given that patient is moving and ambulating well. I spoke with her daughter on the phone who would prefer the patient be admitted and is unhappy about discharge but I feel discharge is appropriate at this time and asked the patient to follow up with her pcp for discussions re: Assisted living/home health.  ____________________________________________   FINAL CLINICAL IMPRESSION(S) / ED DIAGNOSES  Final diagnoses:  Chronic back pain     Jene Every, MD 05/15/15 1455

## 2015-05-15 NOTE — ED Notes (Addendum)
Dr Cyril Loosen spoke with patient daughter and informed of discharge instructions. Pt dressed and awaiting ride at this time  Pt able to ambulate with assistance, uses walker at home.

## 2015-05-15 NOTE — ED Notes (Signed)
MD at bedside. 

## 2015-05-15 NOTE — ED Notes (Signed)
Pt alert and oriented X4, active, cooperative, pt in NAD. RR even and unlabored, color WNL.  Pt informed to return if any life threatening symptoms occur.   

## 2015-05-15 NOTE — ED Notes (Addendum)
Chronic lower back pain, increasing pain over the last few days. Pt has been applying heating pad, reddened areas to lower back. Fluid in bilateral lower ankles, pt states that Dr Welton Flakes put her on fluid pill. Blister to right lower leg that appears to have popped. Pt has extreme pain in lower back with movement. Pt states she is currently being treated for UTI. Pt was seen in ER X 2 days ago for lower back pain, has been taking Tylenol ES at home with no relief. Pt states she has tried taking Vicodin, does not help with pain.

## 2015-06-05 ENCOUNTER — Emergency Department: Payer: Medicare Other

## 2015-06-05 ENCOUNTER — Encounter: Payer: Self-pay | Admitting: Emergency Medicine

## 2015-06-05 ENCOUNTER — Inpatient Hospital Stay
Admission: EM | Admit: 2015-06-05 | Discharge: 2015-06-13 | DRG: 870 | Disposition: A | Discharge status: Hospice | Payer: Medicare Other | Attending: Internal Medicine | Admitting: Internal Medicine

## 2015-06-05 DIAGNOSIS — Z66 Do not resuscitate: Secondary | ICD-10-CM | POA: Diagnosis present

## 2015-06-05 DIAGNOSIS — E870 Hyperosmolality and hypernatremia: Secondary | ICD-10-CM | POA: Diagnosis present

## 2015-06-05 DIAGNOSIS — J449 Chronic obstructive pulmonary disease, unspecified: Secondary | ICD-10-CM | POA: Diagnosis not present

## 2015-06-05 DIAGNOSIS — I10 Essential (primary) hypertension: Secondary | ICD-10-CM | POA: Diagnosis present

## 2015-06-05 DIAGNOSIS — L03115 Cellulitis of right lower limb: Secondary | ICD-10-CM | POA: Diagnosis present

## 2015-06-05 DIAGNOSIS — A4102 Sepsis due to Methicillin resistant Staphylococcus aureus: Secondary | ICD-10-CM | POA: Diagnosis present

## 2015-06-05 DIAGNOSIS — Z8249 Family history of ischemic heart disease and other diseases of the circulatory system: Secondary | ICD-10-CM | POA: Diagnosis not present

## 2015-06-05 DIAGNOSIS — D649 Anemia, unspecified: Secondary | ICD-10-CM | POA: Diagnosis present

## 2015-06-05 DIAGNOSIS — Z87891 Personal history of nicotine dependence: Secondary | ICD-10-CM

## 2015-06-05 DIAGNOSIS — A419 Sepsis, unspecified organism: Secondary | ICD-10-CM | POA: Diagnosis not present

## 2015-06-05 DIAGNOSIS — J9602 Acute respiratory failure with hypercapnia: Secondary | ICD-10-CM | POA: Diagnosis not present

## 2015-06-05 DIAGNOSIS — N179 Acute kidney failure, unspecified: Secondary | ICD-10-CM | POA: Diagnosis present

## 2015-06-05 DIAGNOSIS — J441 Chronic obstructive pulmonary disease with (acute) exacerbation: Secondary | ICD-10-CM | POA: Diagnosis present

## 2015-06-05 DIAGNOSIS — R1310 Dysphagia, unspecified: Secondary | ICD-10-CM | POA: Diagnosis present

## 2015-06-05 DIAGNOSIS — L03116 Cellulitis of left lower limb: Secondary | ICD-10-CM | POA: Diagnosis present

## 2015-06-05 DIAGNOSIS — Z515 Encounter for palliative care: Secondary | ICD-10-CM

## 2015-06-05 DIAGNOSIS — J9622 Acute and chronic respiratory failure with hypercapnia: Secondary | ICD-10-CM | POA: Diagnosis present

## 2015-06-05 DIAGNOSIS — M549 Dorsalgia, unspecified: Secondary | ICD-10-CM | POA: Diagnosis present

## 2015-06-05 DIAGNOSIS — J15212 Pneumonia due to Methicillin resistant Staphylococcus aureus: Secondary | ICD-10-CM | POA: Diagnosis present

## 2015-06-05 DIAGNOSIS — R0602 Shortness of breath: Secondary | ICD-10-CM

## 2015-06-05 DIAGNOSIS — R911 Solitary pulmonary nodule: Secondary | ICD-10-CM | POA: Diagnosis present

## 2015-06-05 DIAGNOSIS — R251 Tremor, unspecified: Secondary | ICD-10-CM | POA: Diagnosis present

## 2015-06-05 DIAGNOSIS — I959 Hypotension, unspecified: Secondary | ICD-10-CM | POA: Diagnosis present

## 2015-06-05 DIAGNOSIS — E872 Acidosis: Secondary | ICD-10-CM | POA: Diagnosis present

## 2015-06-05 DIAGNOSIS — R64 Cachexia: Secondary | ICD-10-CM | POA: Diagnosis present

## 2015-06-05 DIAGNOSIS — I5033 Acute on chronic diastolic (congestive) heart failure: Secondary | ICD-10-CM | POA: Diagnosis present

## 2015-06-05 DIAGNOSIS — J9621 Acute and chronic respiratory failure with hypoxia: Secondary | ICD-10-CM | POA: Insufficient documentation

## 2015-06-05 DIAGNOSIS — I4891 Unspecified atrial fibrillation: Secondary | ICD-10-CM | POA: Diagnosis present

## 2015-06-05 DIAGNOSIS — G9341 Metabolic encephalopathy: Secondary | ICD-10-CM | POA: Diagnosis present

## 2015-06-05 DIAGNOSIS — E43 Unspecified severe protein-calorie malnutrition: Secondary | ICD-10-CM | POA: Diagnosis present

## 2015-06-05 DIAGNOSIS — N289 Disorder of kidney and ureter, unspecified: Secondary | ICD-10-CM

## 2015-06-05 DIAGNOSIS — J9600 Acute respiratory failure, unspecified whether with hypoxia or hypercapnia: Secondary | ICD-10-CM

## 2015-06-05 HISTORY — DX: Essential (primary) hypertension: I10

## 2015-06-05 HISTORY — DX: Heart failure, unspecified: I50.9

## 2015-06-05 HISTORY — DX: Chronic obstructive pulmonary disease, unspecified: J44.9

## 2015-06-05 LAB — URINALYSIS COMPLETE WITH MICROSCOPIC (ARMC ONLY)
BACTERIA UA: NONE SEEN
Bilirubin Urine: NEGATIVE
GLUCOSE, UA: NEGATIVE mg/dL
Hgb urine dipstick: NEGATIVE
Ketones, ur: NEGATIVE mg/dL
LEUKOCYTES UA: NEGATIVE
Nitrite: NEGATIVE
PROTEIN: NEGATIVE mg/dL
RBC / HPF: NONE SEEN RBC/hpf (ref 0–5)
Specific Gravity, Urine: 1.011 (ref 1.005–1.030)
WBC UA: NONE SEEN WBC/hpf (ref 0–5)
pH: 5 (ref 5.0–8.0)

## 2015-06-05 LAB — BLOOD GAS, ARTERIAL
ACID-BASE EXCESS: 10.1 mmol/L — AB (ref 0.0–3.0)
ALLENS TEST (PASS/FAIL): POSITIVE — AB
Acid-Base Excess: 7.7 mmol/L — ABNORMAL HIGH (ref 0.0–3.0)
Allens test (pass/fail): POSITIVE — AB
BICARBONATE: 43.3 meq/L — AB (ref 21.0–28.0)
Bicarbonate: 31 mEq/L — ABNORMAL HIGH (ref 21.0–28.0)
FIO2: 0.28
FIO2: 0.5
LHR: 16 {breaths}/min
MECHANICAL RATE: 16
MECHVT: 450 mL
O2 Saturation: 95.7 %
O2 Saturation: 99.8 %
PATIENT TEMPERATURE: 37
PCO2 ART: 116 mmHg — AB (ref 32.0–48.0)
PEEP/CPAP: 5 cmH2O
PH ART: 7.18 — AB (ref 7.350–7.450)
PH ART: 7.52 — AB (ref 7.350–7.450)
PO2 ART: 225 mmHg — AB (ref 83.0–108.0)
Patient temperature: 37
pCO2 arterial: 38 mmHg (ref 32.0–48.0)
pO2, Arterial: 97 mmHg (ref 83.0–108.0)

## 2015-06-05 LAB — COMPREHENSIVE METABOLIC PANEL
ALBUMIN: 3.6 g/dL (ref 3.5–5.0)
ALT: 18 U/L (ref 14–54)
AST: 16 U/L (ref 15–41)
Alkaline Phosphatase: 106 U/L (ref 38–126)
Anion gap: 7 (ref 5–15)
BUN: 57 mg/dL — ABNORMAL HIGH (ref 6–20)
CALCIUM: 13.3 mg/dL — AB (ref 8.9–10.3)
CO2: 34 mmol/L — AB (ref 22–32)
CREATININE: 1.59 mg/dL — AB (ref 0.44–1.00)
Chloride: 107 mmol/L (ref 101–111)
GFR calc Af Amer: 34 mL/min — ABNORMAL LOW (ref >60)
GFR, EST NON AFRICAN AMERICAN: 29 mL/min — AB (ref >60)
Glucose, Bld: 105 mg/dL — ABNORMAL HIGH (ref 65–99)
Potassium: 5 mmol/L (ref 3.5–5.1)
SODIUM: 148 mmol/L — AB (ref 135–145)
Total Bilirubin: 1.3 mg/dL — ABNORMAL HIGH (ref 0.3–1.2)
Total Protein: 6.5 g/dL (ref 6.5–8.1)

## 2015-06-05 LAB — CBC WITH DIFFERENTIAL/PLATELET
BASOS ABS: 0 10*3/uL (ref 0–0.1)
BASOS PCT: 0 %
Eosinophils Absolute: 0 10*3/uL (ref 0–0.7)
Eosinophils Relative: 0 %
HEMATOCRIT: 46 % (ref 35.0–47.0)
Hemoglobin: 14.5 g/dL (ref 12.0–16.0)
LYMPHS PCT: 8 %
Lymphs Abs: 1.2 10*3/uL (ref 1.0–3.6)
MCH: 29.7 pg (ref 26.0–34.0)
MCHC: 31.4 g/dL — ABNORMAL LOW (ref 32.0–36.0)
MCV: 94.5 fL (ref 80.0–100.0)
MONO ABS: 1 10*3/uL — AB (ref 0.2–0.9)
Monocytes Relative: 7 %
NEUTROS ABS: 11.9 10*3/uL — AB (ref 1.4–6.5)
Neutrophils Relative %: 85 %
Platelets: 181 10*3/uL (ref 150–440)
RBC: 4.87 MIL/uL (ref 3.80–5.20)
RDW: 18.2 % — ABNORMAL HIGH (ref 11.5–14.5)
WBC: 14 10*3/uL — AB (ref 3.6–11.0)

## 2015-06-05 LAB — LACTIC ACID, PLASMA
LACTIC ACID, VENOUS: 1.5 mmol/L (ref 0.5–2.0)
LACTIC ACID, VENOUS: 1.1 mmol/L (ref 0.5–2.0)

## 2015-06-05 LAB — TROPONIN I: TROPONIN I: 0.04 ng/mL — AB (ref <0.031)

## 2015-06-05 MED ORDER — ACETAMINOPHEN 325 MG PO TABS
650.0000 mg | ORAL_TABLET | Freq: Four times a day (QID) | ORAL | Status: DC | PRN
  Administered 2015-06-09 – 2015-06-10 (×3): 650 mg via ORAL
  Filled 2015-06-05 (×3): qty 2

## 2015-06-05 MED ORDER — OXYCODONE HCL 5 MG PO TABS: 5.0000 mg | ORAL_TABLET | ORAL | Status: DC | PRN

## 2015-06-05 MED ORDER — ACETAMINOPHEN 650 MG RE SUPP
650.0000 mg | Freq: Four times a day (QID) | RECTAL | Status: DC | PRN
  Administered 2015-06-10: 650 mg via RECTAL
  Filled 2015-06-05 (×2): qty 1

## 2015-06-05 MED ORDER — IPRATROPIUM-ALBUTEROL 0.5-2.5 (3) MG/3ML IN SOLN
3.0000 mL | RESPIRATORY_TRACT | Status: DC
  Administered 2015-06-05 – 2015-06-06 (×3): 3 mL via RESPIRATORY_TRACT
  Filled 2015-06-05 (×3): qty 3

## 2015-06-05 MED ORDER — HEPARIN SODIUM (PORCINE) 5000 UNIT/ML IJ SOLN
5000.0000 [IU] | Freq: Three times a day (TID) | INTRAMUSCULAR | Status: DC
  Administered 2015-06-05 – 2015-06-08 (×9): 5000 [IU] via SUBCUTANEOUS
  Filled 2015-06-05 (×9): qty 1

## 2015-06-05 MED ORDER — SODIUM CHLORIDE 0.9 % IV SOLN
INTRAVENOUS | Status: DC
  Administered 2015-06-06 – 2015-06-08 (×2): via INTRAVENOUS

## 2015-06-05 MED ORDER — MIDAZOLAM HCL 5 MG/5ML IJ SOLN
2.0000 mg | Freq: Once | INTRAMUSCULAR | Status: AC
  Administered 2015-06-05: 2 mg via INTRAVENOUS

## 2015-06-05 MED ORDER — SUCCINYLCHOLINE CHLORIDE 20 MG/ML IJ SOLN
INTRAMUSCULAR | Status: AC | PRN
  Administered 2015-06-05: 50 mg via INTRAVENOUS

## 2015-06-05 MED ORDER — SODIUM CHLORIDE 0.9 % IV SOLN
1.0000 mg/h | INTRAVENOUS | Status: DC
  Administered 2015-06-05: 0.5 mg/h via INTRAVENOUS
  Administered 2015-06-06 (×2): 2 mg/h via INTRAVENOUS
  Filled 2015-06-05 (×2): qty 10

## 2015-06-05 MED ORDER — MORPHINE SULFATE 2 MG/ML IJ SOLN: 2.0000 mg | INTRAMUSCULAR | Status: DC | PRN

## 2015-06-05 MED ORDER — ONDANSETRON HCL 4 MG/2ML IJ SOLN: 4.0000 mg | Freq: Four times a day (QID) | INTRAMUSCULAR | Status: DC | PRN

## 2015-06-05 MED ORDER — ONDANSETRON HCL 4 MG PO TABS: 4.0000 mg | ORAL_TABLET | Freq: Four times a day (QID) | ORAL | Status: DC | PRN

## 2015-06-05 MED ORDER — SODIUM CHLORIDE 0.9 % IV SOLN
Freq: Once | INTRAVENOUS | Status: AC
  Administered 2015-06-05: 22:00:00 via INTRAVENOUS

## 2015-06-05 MED ORDER — VANCOMYCIN HCL IN DEXTROSE 1-5 GM/200ML-% IV SOLN
1000.0000 mg | Freq: Once | INTRAVENOUS | Status: AC
  Administered 2015-06-05: 1000 mg via INTRAVENOUS
  Filled 2015-06-05: qty 200

## 2015-06-05 MED ORDER — SODIUM CHLORIDE 0.9 % IV BOLUS (SEPSIS)
1000.0000 mL | Freq: Once | INTRAVENOUS | Status: AC
  Administered 2015-06-05: 1000 mL via INTRAVENOUS

## 2015-06-05 MED ORDER — VANCOMYCIN HCL IN DEXTROSE 1-5 GM/200ML-% IV SOLN: 1000.0000 mg | Freq: Once | INTRAVENOUS | Status: DC

## 2015-06-05 MED ORDER — MIDAZOLAM HCL 2 MG/2ML IJ SOLN
2.0000 mg | Freq: Once | INTRAMUSCULAR | Status: AC
  Administered 2015-06-05: 2 mg via INTRAVENOUS

## 2015-06-05 MED ORDER — ETOMIDATE 2 MG/ML IV SOLN
INTRAVENOUS | Status: AC | PRN
  Administered 2015-06-05: 30 mg via INTRAVENOUS

## 2015-06-05 MED ORDER — SODIUM CHLORIDE 0.9 % IJ SOLN
3.0000 mL | Freq: Two times a day (BID) | INTRAMUSCULAR | Status: DC
  Administered 2015-06-06 – 2015-06-09 (×7): 3 mL via INTRAVENOUS

## 2015-06-05 MED ORDER — FENTANYL 2500MCG IN NS 250ML (10MCG/ML) PREMIX INFUSION
10.0000 ug/h | INTRAVENOUS | Status: DC
Start: 2015-06-05 — End: 2015-06-07
  Administered 2015-06-05: 10 ug/h via INTRAVENOUS
  Administered 2015-06-06 – 2015-06-07 (×2): 150 ug/h via INTRAVENOUS
  Administered 2015-06-07: 100 ug/h via INTRAVENOUS
  Filled 2015-06-05 (×3): qty 250

## 2015-06-05 NOTE — ED Notes (Signed)
Intubated by Dr Ladona Ridgel [id number] size 7.5 21 at the lip

## 2015-06-05 NOTE — ED Notes (Signed)
Pt to rm 6 via EMS.  Per EMS, pt w/ altered mental status and weakness over past week, but worse starting this afternoon.  EMS report pt being tx for sores on leg.  Pt took first dose of PO levaquin today.  Pt alert but nonverbal upon arrival.

## 2015-06-05 NOTE — ED Provider Notes (Signed)
[facility] Emergency Department Provider Note ____________________________________________  Time seen: Approximately 8:29 PM  I have reviewed the triage vital signs and the nursing notes.   HISTORY  Chief Complaint Altered Mental Status and Weakness  HPI Priscilla Powers is a 81 y.o. female corresponding by EMS from home for altered mental status. The history we got is that the patient has just had progressive worsening altered mental status and weakness. Patient was currently recently started on Levaquin for questionable cellulitis of her bilateral legs. Family had stated that her mental status she's got much worse today. On arrival EMS they noted that the patient was definitely lethargic, her mucous membranes were dry, she had redness of her bilateral lower extremities with open sores and lesions, but she will follow some commands though she did not respond much verbally. Patient could not answer to questions of the severity of her illnesses quality context or any modifying factors, she just could not answer any other questions secondary to her condition.  Past Medical History  Diagnosis Date  . COPD (chronic obstructive pulmonary disease)   . CHF (congestive heart failure)   . Hypertension     There are no active problems to display for this patient.   Past Surgical History  Procedure Laterality Date  . Abdominal hysterectomy      Current Outpatient Rx  Name  Route  Sig  Dispense  Refill  . ADVAIR DISKUS 250-50 MCG/DOSE AEPB   Inhalation   Inhale 1 puff into the lungs every 12 (twelve) hours.      0     Dispense as written.   . furosemide (LASIX) 40 MG tablet   Oral   Take 1 tablet by mouth 2 (two) times daily.      0   . levofloxacin (LEVAQUIN) 750 MG tablet   Oral   Take 750 mg by mouth daily.         . potassium chloride (K-DUR) 10 MEQ tablet   Oral   Take 1 tablet by mouth daily.      0   . propranolol (INDERAL) 40 MG tablet    Oral   Take 1 tablet by mouth daily.      0   . SPIRIVA HANDIHALER 18 MCG inhalation capsule   Oral   Take 1 capsule by mouth daily.      1     Dispense as written.   Marland Kitchen spironolactone (ALDACTONE) 25 MG tablet   Oral   Take 1 tablet by mouth daily.      0   . nitrofurantoin, macrocrystal-monohydrate, (MACROBID) 100 MG capsule   Oral   Take 1 capsule (100 mg total) by mouth 2 (two) times daily.   10 capsule   0   . oxyCODONE-acetaminophen (ROXICET) 5-325 MG per tablet   Oral   Take 1 tablet by mouth every 8 (eight) hours as needed. Patient not taking: Reported on 06/05/2015   30 tablet   0     Allergies Penicillins  History reviewed. No pertinent family history.  Social History Social History  Substance Use Topics  . Smoking status: Never Smoker   . Smokeless tobacco: None  . Alcohol Use: No    Review of Systems I am unable to perform an adequate review of systems secondary to the patient's condition and altered mental status. 10-point ROS otherwise negative.  ____________________________________________   PHYSICAL EXAM:  VITAL SIGNS: ED Triage Vitals  Enc Vitals Group  BP --      Pulse --      Resp --      Temp --      Temp src --      SpO2 --      Weight --      Height --      Head Cir --      Peak Flow --      Pain Score --      Pain Loc --      Pain Edu? --      Excl. in GC? --     Constitutional: Patient is mildly lethargic and oriented to at least her name. She does not appear to be oriented to place and time.. Patient in mild distress secondary to her tachypnea and her mucous membranes are dry. Eyes: Conjunctivae are normal. PERRL. EOMI. Head: Atraumatic. Nose: No congestion/rhinnorhea. Mouth/Throat: Mucous membranes are dry.  Oropharynx non-erythematous. Neck: No stridor.   Cardiovascular: Normal rate, regular rhythm. Grossly normal heart sounds.  Good peripheral circulation. Respiratory: Patient is mildly tachypneic.Marland Kitchen  Patient  with mild retractions. Lungs CTAB. Gastrointestinal: Soft and nontender. No distention. No abdominal bruits. No CVA tenderness. Musculoskeletal: Patient with 2+ pitting edema to her bilateral lower extremities the right lower extremity greater than the left. She has extensive redness to the anterior portions of both tib fibs including the foot on the right as well as her tib-fib on the right, with several ulcerative weeping lesions on her right leg and right foot. Patient does have palpable pulses that are weak bilaterally.  No joint effusions. Neurologic:  Normal speech and language. No gross focal neurologic deficits are appreciated. No gait instability. Skin:  . No rash noted. Patient with weeping lesions to her bilateral lower extremities but the right side greater than the left. Psychiatric: Patient appears to be pleasant but she does not respond much verbally except for occasional yes or no. If you ask her to smile she will smile but she does not follow many other commands secondary to the shear fact that she does not feel well. ____________________________________________   LABS (all labs ordered are listed, but only abnormal results are displayed)  Labs Reviewed  CBC WITH DIFFERENTIAL/PLATELET - Abnormal; Notable for the following:    WBC 14.0 (*)    MCHC 31.4 (*)    RDW 18.2 (*)    Neutro Abs 11.9 (*)    Monocytes Absolute 1.0 (*)    All other components within normal limits  BLOOD GAS, ARTERIAL - Abnormal; Notable for the following:    pH, Arterial 7.18 (*)    pCO2 arterial 116 (*)    Bicarbonate 43.3 (*)    Acid-Base Excess 10.1 (*)    Allens test (pass/fail) POSITIVE (*)    All other components within normal limits  TROPONIN I - Abnormal; Notable for the following:    Troponin I 0.04 (*)    All other components within normal limits  COMPREHENSIVE METABOLIC PANEL - Abnormal; Notable for the following:    Sodium 148 (*)    CO2 34 (*)    Glucose, Bld 105 (*)    BUN 57 (*)     Creatinine, Ser 1.59 (*)    Calcium 13.3 (*)    Total Bilirubin 1.3 (*)    GFR calc non Af Amer 29 (*)    GFR calc Af Amer 34 (*)    All other components within normal limits  CULTURE, BLOOD (ROUTINE X 2)  CULTURE,  BLOOD (ROUTINE X 2)  WOUND CULTURE  ANAEROBIC CULTURE  LACTIC ACID, PLASMA  URINALYSIS COMPLETEWITH MICROSCOPIC ([facility] ONLY)  LACTIC ACID, PLASMA  BLOOD GAS, ARTERIAL   ____________________________________________  EKG  ED ECG REPORT I, Leona Carry, the attending physician, personally viewed and interpreted this ECG.   Date: 06/05/2015  EKG Time: 2030  Rate: 79  Rhythm: Normal sinus rhythm with occasional PACs and atrial pacing.  Axis: Left axis deviation  Intervals:left anterior fascicular block  ST&T Change: Nonspecific ST and T-wave changes and patient has LVH with repolarization abnormality  ____________________________________________  RADIOLOGY Dg Chest 1 View  06/05/2015   CLINICAL DATA:  Altered mental status. Weakness. Shortness of breath.  EXAM: CHEST  1 VIEW  COMPARISON:  One-view chest x-ray 02/22/2014.  FINDINGS: Cardiomegaly is again noted. Lung volumes are low. This is a reversed lordotic view. No focal airspace disease is evident. The visualized soft tissues and bony thorax are unremarkable.  IMPRESSION: 1. Stable cardiomegaly without failure.   Electronically Signed   By: Marin Roberts M.D.   On: 06/05/2015 21:22   Dg Lumbar Spine Complete  05/13/2015   CLINICAL DATA:  Acute onset of lower back pain.  Initial encounter.  EXAM: LUMBAR SPINE - COMPLETE 4+ VIEW  COMPARISON:  Lumbar spine radiographs and CT performed 03/03/2010, and lumbar spine MRI performed 03/04/2010  FINDINGS: There is no evidence of acute fracture or subluxation. There appears to be significant chronic compression deformity of vertebral body L1, worsened from prior studies. Vertebral bodies demonstrate normal alignment. Mild disc space narrowing is noted at L5-S1.  The  visualized bowel gas pattern is unremarkable in appearance; air and stool are noted within the colon. The sacroiliac joints are within normal limits. Scattered vascular calcifications are seen.  IMPRESSION: 1. No evidence of acute fracture or subluxation along the lumbar spine. 2. Significant chronic compression deformity of vertebral body L1, worsened from prior studies.   Electronically Signed   By: Roanna Raider M.D.   On: 05/13/2015 02:23    ____________________________________________   PROCEDURES  Procedure(s) performed: See procedure note   .INTUBATION Performed by: Leona Carry  Required items: required blood products, implants, devices, and special equipment available Patient identity confirmed: provided demographic data and hospital-assigned identification number Time out: Immediately prior to procedure a "time out" was called to verify the correct patient, procedure, equipment, support staff and site/side marked as required.  Indications: Acute respiratory failure   Intubation method: No regular physician   Preoxygenation: Bag-valve-mask   Sedatives: 20 mg Etomidate Paralytic: 50 mg Succinylcholine  Tube Size: 7.5 cuffed  Post-procedure assessment: chest rise and ETCO2 monitor Breath sounds: equal and absent over the epigastrium Tube secured with: ETT holder Chest x-ray interpreted by radiologist and me.  Chest x-ray findings: Yes endotracheal tube in appropriate position  Patient tolerated the procedure well with no immediate complications.    Critical Care performed: Yes, see critical care note(s) CRITICAL CARE Performed by: Leona Carry   Total critical care time: 30-74 minutes Critical care time was exclusive of separately billable procedures and treating other patients.  Critical care was necessary to treat or prevent imminent or life-threatening deterioration.  Critical care was time spent personally by me on the following activities:  development of treatment plan with patient and/or surrogate as well as nursing, discussions with consultants, evaluation of patient's response to treatment, examination of patient, obtaining history from patient or surrogate, ordering and performing treatments and interventions, ordering and  review of laboratory studies, ordering and review of radiographic studies, pulse oximetry and re-evaluation of patient's condition. Patient had to be intubated and placed on a ventilator as well. ____________________________________________   INITIAL IMPRESSION / ASSESSMENT AND PLAN / ED COURSE  Pertinent labs & imaging results that were available during my care of the patient were reviewed by me and considered in my medical decision making (see chart for details). ----------------------------------------- 8:39 PM on 06/05/2015 ----------------------------------------- Patient is going to have a rectal temp along with being started on IV fluids and we will get blood and wound cultures for her cellulitis. Patient will also be getting CT head for her altered mental status and chest x-ray. ____________________________________________ ----------------------------------------- 11:18 PM on 06/05/2015 -----------------------------------------  About 2150, patient got an ABG which showed acute respiratory failure. Patient was under moderate sedation intubated with a 7.5 mm ET tube. Portable chest x-ray for placement was showing that the tube was in good position. Patient tolerated the procedure well and was started on some IV vancomycin for her cellulitis. Patient had blood wound and urine culture sent. Dr. Dimas Aguas hospitalist is going to admit the patient.  FINAL CLINICAL IMPRESSION(S) / ED DIAGNOSES  Final diagnoses:  Acute respiratory failure, unspecified whether with hypoxia or hypercapnia  Sepsis, due to unspecified organism  Bilateral cellulitis of lower leg  Hypercalcemia  Renal insufficiency       Leona Carry, MD 06/05/15 2320

## 2015-06-05 NOTE — H&P (Signed)
[facility] - [place] at [facility]   PATIENT NAME: Priscilla Powers    MR#:  [id number]  DATE OF BIRTH:  [birth year]   DATE OF ADMISSION:  06/05/2015  PRIMARY CARE PHYSICIAN: No primary care provider on file.   REQUESTING/REFERRING PHYSICIAN: Suella Broad  CHIEF COMPLAINT:   Chief Complaint  Patient presents with  . Altered Mental Status  . Weakness    HISTORY OF PRESENT ILLNESS:  Priscilla Powers  is a 81 y.o. female with a known history of COPD non-oxygen requiring, essential hypertension presenting with altered mental status. The patient is unable to provide meaningful information given mental status/medical condition. History obtained from family members present at bedside. They state approximately 3-4 days altered mental status described mainly as confusion today worsening including lethargy. One day duration of lower extremity edema and weeping ulcers with associated redness. They deny any fevers chills further symptomatology. Upon arrival to the emergency department noted be in respiratory distress and markedly somnolent ABG performed reveals elevated CO2 subsequently underwent rapid sequence intubation.  PAST MEDICAL HISTORY:   Past Medical History  Diagnosis Date  . COPD (chronic obstructive pulmonary disease)   . CHF (congestive heart failure)   . Hypertension     PAST SURGICAL HISTORY:   Past Surgical History  Procedure Laterality Date  . Abdominal hysterectomy      SOCIAL HISTORY:   Social History  Substance Use Topics  . Smoking status: Never Smoker   . Smokeless tobacco: Not on file  . Alcohol Use: No    FAMILY HISTORY:   Family History  Problem Relation Age of Onset  . Hypertension Other     DRUG ALLERGIES:   Allergies  Allergen Reactions  . Penicillins Other (See Comments)    Reaction:  Unknown     REVIEW OF SYSTEMS:  Unobtainable given patient's mental status/medical condition   MEDICATIONS AT HOME:   Prior to  Admission medications   Medication Sig Start Date End Date Taking? Authorizing Provider  Fluticasone-Salmeterol (ADVAIR) 250-50 MCG/DOSE AEPB Inhale 1 puff into the lungs 2 (two) times daily.   Yes Historical Provider, MD  furosemide (LASIX) 40 MG tablet Take 40 mg by mouth 2 (two) times daily.   Yes Historical Provider, MD  levofloxacin (LEVAQUIN) 750 MG tablet Take 750 mg by mouth daily.   Yes Historical Provider, MD  potassium chloride (K-DUR) 10 MEQ tablet Take 10 mEq by mouth daily.   Yes Historical Provider, MD  propranolol (INDERAL) 40 MG tablet Take 40 mg by mouth daily.   Yes Historical Provider, MD  spironolactone (ALDACTONE) 25 MG tablet Take 25 mg by mouth daily.   Yes Historical Provider, MD  tiotropium (SPIRIVA) 18 MCG inhalation capsule Place 18 mcg into inhaler and inhale daily.   Yes Historical Provider, MD  nitrofurantoin, macrocrystal-monohydrate, (MACROBID) 100 MG capsule Take 1 capsule (100 mg total) by mouth 2 (two) times daily. Patient not taking: Reported on 06/05/2015 05/13/15   Irean Hong, MD  oxyCODONE-acetaminophen (ROXICET) 5-325 MG per tablet Take 1 tablet by mouth every 8 (eight) hours as needed. Patient not taking: Reported on 06/05/2015 05/15/15 05/14/16  Jene Every, MD      VITAL SIGNS:  Blood pressure 84/57, pulse 62, temperature 97.2 F (36.2 C), temperature source Oral, resp. rate 16, height  (1.6 m), weight 125 lb 3.5 oz (56.799 kg), SpO2 100 %.  PHYSICAL EXAMINATION:   VITAL SIGNS: Filed Vitals:   06/06/15 0000  BP: 100/55  Pulse: 66  Temp:   Resp: 14   GENERAL:81 y.o.female intubated/sedated HEAD: Normocephalic, atraumatic.  EYES: Pupils equal, round, reactive to light. Unable to assess extraocular muscles given mental status/medical condition. No scleral icterus.  MOUTH: Moist mucosal membrane. Dentition intact. No abscess noted.  EAR, NOSE, THROAT: Clear without exudates. No external lesions.  NECK: Supple. No thyromegaly. No nodules.  No JVD.  PULMONARY: Coarse breath sounds, without wheeze rails or rhonci. No use of accessory muscles, intubated/mechanically ventilated CHEST: Nontender to palpation.  CARDIOVASCULAR: S1 and S2. Regular rate and rhythm. No murmurs, rubs, or gallops. 1+ edema. Pedal pulses 2+ bilaterally.  GASTROINTESTINAL: Soft, nontender, nondistended. No masses. Positive bowel sounds. No hepatosplenomegaly.  MUSCULOSKELETAL: No swelling, clubbing, or edema. Range of motion full in all extremities.  NEUROLOGIC: Unable to assess given mental status/medical condition SKIN: Bilateral lower extremity erythema right lower extremity worse than left to mid shin, multiple ulcerations both lower extremities without discharge PSYCHIATRIC: Unable to assess given mental status/medical condition     LABORATORY PANEL:   CBC  Recent Labs Lab 06/05/15 2026  WBC 14.0*  HGB 14.5  HCT 46.0  PLT 181   ------------------------------------------------------------------------------------------------------------------  Chemistries   Recent Labs Lab 06/05/15 2140  NA 148*  K 5.0  CL 107  CO2 34*  GLUCOSE 105*  BUN 57*  CREATININE 1.59*  CALCIUM 13.3*  AST 16  ALT 18  ALKPHOS 106  BILITOT 1.3*   ------------------------------------------------------------------------------------------------------------------  Cardiac Enzymes  Recent Labs Lab 06/05/15 2140  TROPONINI 0.04*   ------------------------------------------------------------------------------------------------------------------  RADIOLOGY:  Dg Chest 1 View  06/05/2015   CLINICAL DATA:  Altered mental status. Weakness. Shortness of breath.  EXAM: CHEST  1 VIEW  COMPARISON:  One-view chest x-ray 02/22/2014.  FINDINGS: Cardiomegaly is again noted. Lung volumes are low. This is a reversed lordotic view. No focal airspace disease is evident. The visualized soft tissues and bony thorax are unremarkable.  IMPRESSION: 1. Stable cardiomegaly without  failure.   Electronically Signed   By: Marin Roberts M.D.   On: 06/05/2015 21:22    EKG:   Orders placed or performed in visit on 02/22/14  . EKG 12-Lead    IMPRESSION AND PLAN:   81 year old Caucasian female history of COPD presenting with altered mental status.  1. Acute respiratory failure with hypercapnia: Full vent support, wean PEEP/FiO2, she was started on fentanyl/Versed for sedation emergency department continue these, add DuoNeb treatments 2.Sepsis, meeting septic criteria by heart rate, leukocytosis, respiratory rate present on arrival. Source cellulitis Panculture. Broad-spectrum antibiotics including vancomycin and taper antibiotics when culture data returns.Continue IV fluid hydration to keep mean arterial pressure greater than 65. He may require pressor therapy if blood pressure worsens. We will repeat lactic acid given the initial is greater than 2.2.  3. Essential hypertension: We will hold Aldactone, Lasix, propranolol given relative hypotension 4. Venous thrombi embolism prophylactic: Heparin subcutaneous     All the records are reviewed and case discussed with ED provider. Management plans discussed with the patient, family and they are in agreement.  CODE STATUS: Full  TOTAL TIME TAKING CARE OF THIS PATIENT: 45 critical care minutes.    Teva Bronkema,  Mardi Mainland.D on 06/05/2015 at 11:57 PM  Between 7am to 6pm - Pager - [phone]  After 6pm: House Pager: - [phone]  [facility]  Office  [phone]  CC: Primary care physician; No primary care provider on file.

## 2015-06-06 ENCOUNTER — Inpatient Hospital Stay: Payer: Medicare Other

## 2015-06-06 DIAGNOSIS — R0689 Other abnormalities of breathing: Secondary | ICD-10-CM

## 2015-06-06 LAB — CBC
HCT: 35.9 % (ref 35.0–47.0)
HEMOGLOBIN: 11.1 g/dL — AB (ref 12.0–16.0)
MCH: 29.9 pg (ref 26.0–34.0)
MCHC: 31.1 g/dL — ABNORMAL LOW (ref 32.0–36.0)
MCV: 96.2 fL (ref 80.0–100.0)
PLATELETS: 114 10*3/uL — AB (ref 150–440)
RBC: 3.73 MIL/uL — ABNORMAL LOW (ref 3.80–5.20)
RDW: 17 % — AB (ref 11.5–14.5)
WBC: 8.8 10*3/uL (ref 3.6–11.0)

## 2015-06-06 LAB — MRSA PCR SCREENING: MRSA BY PCR: POSITIVE — AB

## 2015-06-06 LAB — COMPREHENSIVE METABOLIC PANEL
ALT: 14 U/L (ref 14–54)
AST: 18 U/L (ref 15–41)
Albumin: 2.6 g/dL — ABNORMAL LOW (ref 3.5–5.0)
Alkaline Phosphatase: 76 U/L (ref 38–126)
Anion gap: 5 (ref 5–15)
BUN: 52 mg/dL — ABNORMAL HIGH (ref 6–20)
CALCIUM: 11.4 mg/dL — AB (ref 8.9–10.3)
CHLORIDE: 113 mmol/L — AB (ref 101–111)
CO2: 30 mmol/L (ref 22–32)
Creatinine, Ser: 1.45 mg/dL — ABNORMAL HIGH (ref 0.44–1.00)
GFR calc non Af Amer: 32 mL/min — ABNORMAL LOW (ref >60)
GFR, EST AFRICAN AMERICAN: 37 mL/min — AB (ref >60)
GLUCOSE: 103 mg/dL — AB (ref 65–99)
Potassium: 4.5 mmol/L (ref 3.5–5.1)
Sodium: 148 mmol/L — ABNORMAL HIGH (ref 135–145)
Total Bilirubin: 0.9 mg/dL (ref 0.3–1.2)
Total Protein: 4.8 g/dL — ABNORMAL LOW (ref 6.5–8.1)

## 2015-06-06 LAB — GLUCOSE, CAPILLARY
GLUCOSE-CAPILLARY: 78 mg/dL (ref 65–99)
Glucose-Capillary: 89 mg/dL (ref 65–99)
Glucose-Capillary: 116 mg/dL — ABNORMAL HIGH (ref 65–99)

## 2015-06-06 LAB — LACTIC ACID, PLASMA: LACTIC ACID, VENOUS: 2.3 mmol/L — AB (ref 0.5–2.0)

## 2015-06-06 MED ORDER — VITAL AF 1.2 CAL PO LIQD
1000.0000 mL | ORAL | Status: DC
  Administered 2015-06-06: 1000 mL

## 2015-06-06 MED ORDER — TIOTROPIUM BROMIDE MONOHYDRATE 18 MCG IN CAPS
18.0000 ug | ORAL_CAPSULE | Freq: Every day | RESPIRATORY_TRACT | Status: DC
  Filled 2015-06-06: qty 5

## 2015-06-06 MED ORDER — SODIUM CHLORIDE 0.9 % IV BOLUS (SEPSIS)
500.0000 mL | Freq: Once | INTRAVENOUS | Status: AC
  Administered 2015-06-06: 500 mL via INTRAVENOUS

## 2015-06-06 MED ORDER — METHYLPREDNISOLONE SODIUM SUCC 125 MG IJ SOLR
60.0000 mg | Freq: Four times a day (QID) | INTRAMUSCULAR | Status: DC
  Administered 2015-06-06 – 2015-06-09 (×11): 60 mg via INTRAVENOUS
  Filled 2015-06-06 (×12): qty 2

## 2015-06-06 MED ORDER — MIDAZOLAM HCL 2 MG/2ML IJ SOLN
1.0000 mg | INTRAMUSCULAR | Status: DC | PRN
  Administered 2015-06-07: 1 mg via INTRAVENOUS

## 2015-06-06 MED ORDER — MIDAZOLAM HCL 2 MG/2ML IJ SOLN
1.0000 mg | INTRAMUSCULAR | Status: DC | PRN
  Administered 2015-06-07: 1 mg via INTRAVENOUS
  Filled 2015-06-06 (×2): qty 2

## 2015-06-06 MED ORDER — MOMETASONE FURO-FORMOTEROL FUM 100-5 MCG/ACT IN AERO
2.0000 | INHALATION_SPRAY | Freq: Two times a day (BID) | RESPIRATORY_TRACT | Status: DC
  Filled 2015-06-06: qty 8.8

## 2015-06-06 MED ORDER — LEVOFLOXACIN IN D5W 500 MG/100ML IV SOLN: 500.0000 mg | INTRAVENOUS | Status: DC

## 2015-06-06 MED ORDER — LEVOFLOXACIN IN D5W 500 MG/100ML IV SOLN
500.0000 mg | Freq: Once | INTRAVENOUS | Status: AC
  Administered 2015-06-06: 500 mg via INTRAVENOUS
  Filled 2015-06-06: qty 100

## 2015-06-06 MED ORDER — SODIUM CHLORIDE 0.9 % IV BOLUS (SEPSIS): 1000.0000 mL | Freq: Once | INTRAVENOUS | Status: DC

## 2015-06-06 MED ORDER — IPRATROPIUM-ALBUTEROL 0.5-2.5 (3) MG/3ML IN SOLN
3.0000 mL | Freq: Four times a day (QID) | RESPIRATORY_TRACT | Status: DC
  Administered 2015-06-06 – 2015-06-10 (×17): 3 mL via RESPIRATORY_TRACT
  Filled 2015-06-06 (×19): qty 3

## 2015-06-06 MED ORDER — SODIUM CHLORIDE 0.9 % IV BOLUS (SEPSIS): 1000.0000 mL | Freq: Once | INTRAVENOUS | Status: AC

## 2015-06-06 MED ORDER — LEVOFLOXACIN IN D5W 250 MG/50ML IV SOLN
250.0000 mg | INTRAVENOUS | Status: DC
  Administered 2015-06-07 – 2015-06-08 (×2): 250 mg via INTRAVENOUS
  Filled 2015-06-06 (×5): qty 50

## 2015-06-06 MED ORDER — VANCOMYCIN HCL IN DEXTROSE 750-5 MG/150ML-% IV SOLN
750.0000 mg | INTRAVENOUS | Status: DC
  Administered 2015-06-07 – 2015-06-09 (×3): 750 mg via INTRAVENOUS
  Filled 2015-06-06 (×5): qty 150

## 2015-06-06 MED ORDER — SODIUM CHLORIDE 0.9 % IV BOLUS (SEPSIS)
250.0000 mL | Freq: Once | INTRAVENOUS | Status: AC
  Administered 2015-06-06: 250 mL via INTRAVENOUS

## 2015-06-06 MED ORDER — DOCUSATE SODIUM 50 MG/5ML PO LIQD: 100.0000 mg | Freq: Two times a day (BID) | ORAL | Status: DC | PRN

## 2015-06-06 MED ORDER — BUDESONIDE 0.25 MG/2ML IN SUSP
0.2500 mg | Freq: Two times a day (BID) | RESPIRATORY_TRACT | Status: DC
  Administered 2015-06-06 – 2015-06-08 (×5): 0.25 mg via RESPIRATORY_TRACT
  Filled 2015-06-06 (×5): qty 2

## 2015-06-06 MED ORDER — VITAL HIGH PROTEIN PO LIQD: 1000.0000 mL | ORAL | Status: DC

## 2015-06-06 MED ORDER — MIDAZOLAM HCL 2 MG/2ML IJ SOLN
2.0000 mg | Freq: Once | INTRAMUSCULAR | Status: AC
  Administered 2015-06-06: 2 mg via INTRAVENOUS

## 2015-06-06 MED ORDER — FREE WATER
25.0000 mL | Status: DC
  Administered 2015-06-06 – 2015-06-10 (×22): 25 mL

## 2015-06-06 NOTE — ED Notes (Signed)
Pt to be taken to CT prior to going to ICU

## 2015-06-06 NOTE — Progress Notes (Signed)
ANTIBIOTIC CONSULT NOTE - INITIAL  Pharmacy Consult for vancomycin dosing Indication: sepsis  Allergies  Allergen Reactions  . Penicillins Other (See Comments)    Reaction:  Unknown     Patient Measurements: Height:  (160 cm) Weight: 125 lb 3.5 oz (56.799 kg) IBW/kg (Calculated) : 52.4 Adjusted Body Weight: 56.8kg  Vital Signs: Temp: 97.2 F (36.2 C) (08/12 2215) Temp Source: Oral (08/12 2215) BP: 87/47 mmHg (08/13 0035) Pulse Rate: 70 (08/13 0030) Intake/Output from previous day:   Intake/Output from this shift:    Labs:  Recent Labs  06/05/15 2026 06/05/15 2140  WBC 14.0*  --   HGB 14.5  --   PLT 181  --   CREATININE  --  1.59*   Estimated Creatinine Clearance: 22.2 mL/min (by C-G formula based on Cr of 1.59). No results for input(s): VANCOTROUGH, VANCOPEAK, VANCORANDOM, GENTTROUGH, GENTPEAK, GENTRANDOM, TOBRATROUGH, TOBRAPEAK, TOBRARND, AMIKACINPEAK, AMIKACINTROU, AMIKACIN in the last 72 hours.   Microbiology: No results found for this or any previous visit (from the past 720 hour(s)).  Medical History: Past Medical History  Diagnosis Date  . COPD (chronic obstructive pulmonary disease)   . CHF (congestive heart failure)   . Hypertension     Medications:   Assessment: Blood and wound cx pending UA: (-)  Goal of Therapy:  Vancomycin trough level 15-20 mcg/ml  Plan:  TBW 56.8kg  IBW 52.4kg  DW 56.8kg  Vd 40L kei 0.023 hr-1  t1/2 30 hours 750 mg q 36 hours ordered with stacked dosing. Level before 4th dose.   Fulton Reek, PharmD, BCPS  06/06/2015

## 2015-06-06 NOTE — Progress Notes (Signed)
[facility]                                                                                                                                                                                            Patient Demographics   Priscilla Powers, is a 81 y.o. female, DOB - June 19, 1930, JXB:[id number]  Admit date - 06/05/2015   Admitting Physician Wyatt Haste, MD  Outpatient Primary MD for the patient is No primary care provider on file.   LOS - 1  Subjective: Patient remains on the ventilator. Daughter and her sister at bedside.     Review of Systems:   CONSTITUTIONAL: No documented fever. Unable to provide any further review of systems due to patient being on the ventilator  Vitals:   Filed Vitals:   06/06/15 1000 06/06/15 1100 06/06/15 1200 06/06/15 1300  BP: 92/44  Pulse: 80 70 68 68  Temp: 100.4 F (38 C) 100.2 F (37.9 C) 99.9 F (37.7 C) 99.5 F (37.5 C)  TempSrc:      Resp: Height:      Weight:      SpO2: 100% 100% 100% 100%    Wt Readings from Last 3 Encounters:  06/05/15 56.799 kg (125 lb 3.5 oz)  05/15/15 61.689 kg (136 lb)  05/12/15 62.143 kg (137 lb)     Intake/Output Summary (Last 24 hours) at 06/06/15 1335 Last data filed at 06/06/15 0556  Gross per 24 hour  Intake      0 ml  Output    450 ml  Net   -450 ml    Physical Exam:   GENERAL: Critically ill-appearing female on the ventilator  HEAD, EYES, EARS, NOSE AND THROAT: Atraumatic, normocephalic. . Pupils equal and reactive to light. Sclerae anicteric. No conjunctival injection. No oro-pharyngeal erythema.  NECK: Supple. There is no jugular venous distention. No bruits, no lymphadenopathy, no thyromegaly.  HEART: Regular rate and rhythm,. No murmurs, no rubs, no clicks.  LUNGS: Clear to auscultation bilaterally. No rales or rhonchi. No wheezes.  ABDOMEN: Soft, flat, nontender, nondistended. Has good bowel sounds. No  hepatosplenomegaly appreciated.  EXTREMITIES: No evidence of any cyanosis, clubbing, or peripheral edema.  +2 pedal and radial pulses bilaterally.  NEUROLOGIC: Sedated on the ventilator SKIN: Bilateral lower extremity erythema and warmth Psych: Sedated and on the ventilator LN: No inguinal LN enlargement    Antibiotics   Anti-infectives    Start     Dose/Rate Route Frequency Ordered Stop   06/06/15 2300  vancomycin (VANCOCIN) IVPB 750 mg/150 ml premix  750 mg 150 mL/hr over 60 Minutes Intravenous Every 36 hours 06/06/15 0037     06/06/15 0000  vancomycin (VANCOCIN) IVPB 1000 mg/200 mL premix  Status:  Discontinued     1,000 mg 200 mL/hr over 60 Minutes Intravenous  Once 06/05/15 2357 06/05/15 2357   06/05/15 2330  vancomycin (VANCOCIN) IVPB 1000 mg/200 mL premix     1,000 mg 200 mL/hr over 60 Minutes Intravenous  Once 06/05/15 2316 06/06/15 0133      Medications   Scheduled Meds: . budesonide (PULMICORT) nebulizer solution  0.25 mg Nebulization BID  . feeding supplement (VITAL AF 1.2 CAL)  1,000 mL Per Tube Q24H  . free water  25 mL Per Tube 6 times per day  . heparin  5,000 Units Subcutaneous 3 times per day  . ipratropium-albuterol  3 mL Nebulization Q6H  . methylPREDNISolone (SOLU-MEDROL) injection  60 mg Intravenous Q6H  . sodium chloride  1,000 mL Intravenous Once  . sodium chloride  250 mL Intravenous Once  . sodium chloride  3 mL Intravenous Q12H  . vancomycin  750 mg Intravenous Q36H   Continuous Infusions: . sodium chloride 100 mL/hr at 06/06/15 0023  . fentaNYL infusion INTRAVENOUS 150 mcg/hr (06/06/15 0217)   PRN Meds:.acetaminophen **OR** acetaminophen, midazolam, midazolam, ondansetron **OR** ondansetron (ZOFRAN) IV   Data Review:   Micro Results Recent Results (from the past 240 hour(s))  MRSA PCR Screening     Status: Abnormal   Collection Time: 06/05/15  8:16 PM  Result Value Ref Range Status   MRSA by PCR POSITIVE (A) NEGATIVE Final    Comment:         The GeneXpert MRSA Assay (FDA approved for NASAL specimens only), is one component of a comprehensive MRSA colonization surveillance program. It is not intended to diagnose MRSA infection nor to guide or monitor treatment for MRSA infections. CRITICAL RESULT CALLED TO, READ BACK BY AND VERIFIED WITH: CHRISTY AUSTIN AT 1610 06/06/15 SDR   Wound culture     Status: None (Preliminary result)   Collection Time: 06/05/15  8:54 PM  Result Value Ref Range Status   Specimen Description ABSCESS  Final   Special Requests Normal  Final   Gram Stain PENDING  Incomplete   Culture   Final    HEAVY GROWTH STAPHYLOCOCCUS AUREUS SUSCEPTIBILITIES TO FOLLOW    Report Status PENDING  Incomplete    Radiology Reports Dg Chest 1 View  06/05/2015   CLINICAL DATA:  Endotracheal tube placement.  Initial encounter.  EXAM: CHEST  1 VIEW  COMPARISON:  Chest radiograph performed earlier today at 8:45 p.m.  FINDINGS: The patient's endotracheal tube is seen ending 4 cm above the carina. An enteric tube is noted extending below the diaphragm.  The lungs appear clear bilaterally. No focal consolidation, pleural effusion or pneumothorax is seen. Mild scarring is suggested at the right lung apex, though this may reflect overlying tubing.  The cardiomediastinal silhouette is borderline enlarged. No acute osseous abnormalities are identified. A large paraesophageal hernia is again noted.  IMPRESSION: 1. Endotracheal tube seen ending 4 cm above the carina. 2. Lungs clear bilaterally. 3. Borderline cardiomegaly. 4. Large paraesophageal hernia again noted.   Electronically Signed   By: Roanna Raider M.D.   On: 06/05/2015 23:57   Dg Chest 1 View  06/05/2015   CLINICAL DATA:  Altered mental status. Weakness. Shortness of breath.  EXAM: CHEST  1 VIEW  COMPARISON:  One-view chest x-ray 02/22/2014.  FINDINGS: Cardiomegaly is again noted.  Lung volumes are low. This is a reversed lordotic view. No focal airspace disease is  evident. The visualized soft tissues and bony thorax are unremarkable.  IMPRESSION: 1. Stable cardiomegaly without failure.   Electronically Signed   By: Marin Roberts M.D.   On: 06/05/2015 21:22   Dg Lumbar Spine Complete  05/13/2015   CLINICAL DATA:  Acute onset of lower back pain.  Initial encounter.  EXAM: LUMBAR SPINE - COMPLETE 4+ VIEW  COMPARISON:  Lumbar spine radiographs and CT performed 03/03/2010, and lumbar spine MRI performed 03/04/2010  FINDINGS: There is no evidence of acute fracture or subluxation. There appears to be significant chronic compression deformity of vertebral body L1, worsened from prior studies. Vertebral bodies demonstrate normal alignment. Mild disc space narrowing is noted at L5-S1.  The visualized bowel gas pattern is unremarkable in appearance; air and stool are noted within the colon. The sacroiliac joints are within normal limits. Scattered vascular calcifications are seen.  IMPRESSION: 1. No evidence of acute fracture or subluxation along the lumbar spine. 2. Significant chronic compression deformity of vertebral body L1, worsened from prior studies.   Electronically Signed   By: Roanna Raider M.D.   On: 05/13/2015 02:23   Ct Head Wo Contrast  06/06/2015   CLINICAL DATA:  Acute onset of altered mental status. High blood pressure. Initial encounter.  EXAM: CT HEAD WITHOUT CONTRAST  TECHNIQUE: Contiguous axial images were obtained from the base of the skull through the vertex without intravenous contrast.  COMPARISON:  None.  FINDINGS: There is no evidence of acute infarction, mass lesion, or intra- or extra-axial hemorrhage on CT.  Prominence of the ventricles and sulci likely reflects mild cortical volume loss. Scattered periventricular and subcortical white matter change reflects small vessel ischemic microangiopathy. Mild cerebellar atrophy is noted. Chronic ischemic change is noted at the external capsule bilaterally.  The brainstem and fourth ventricle are  within normal limits. The basal ganglia are unremarkable in appearance. The cerebral hemispheres demonstrate grossly normal gray-white differentiation. No mass effect or midline shift is seen.  There is no evidence of fracture; visualized osseous structures are unremarkable in appearance. The visualized portions of the orbits are within normal limits. The paranasal sinuses and mastoid air cells are well-aerated. No significant soft tissue abnormalities are seen.  IMPRESSION: 1. No acute intracranial pathology seen on CT. 2. Mild cortical volume loss and scattered small vessel ischemic microangiopathy.   Electronically Signed   By: Roanna Raider M.D.   On: 06/06/2015 02:15     CBC  Recent Labs Lab 06/05/15 2026 06/06/15 0240  WBC 14.0* 8.8  HGB 14.5 11.1*  HCT 46.0 35.9  PLT 181 114*  MCV 94.5 96.2  MCH 29.7 29.9  MCHC 31.4* 31.1*  RDW 18.2* 17.0*  LYMPHSABS 1.2  --   MONOABS 1.0*  --   EOSABS 0.0  --   BASOSABS 0.0  --     Chemistries   Recent Labs Lab 06/05/15 2140 06/06/15 0240  NA 148* 148*  K 5.0 4.5  CL 107 113*  CO2 34* 30  GLUCOSE 105* 103*  BUN 57* 52*  CREATININE 1.59* 1.45*  CALCIUM 13.3* 11.4*  AST 16 18  ALT 18 14  ALKPHOS 106 76  BILITOT 1.3* 0.9   ------------------------------------------------------------------------------------------------------------------ estimated creatinine clearance is 24.3 mL/min (by C-G formula based on Cr of 1.45). ------------------------------------------------------------------------------------------------------------------ No results for input(s): HGBA1C in the last 72 hours. ------------------------------------------------------------------------------------------------------------------ No results for input(s): CHOL, HDL, LDLCALC, TRIG, CHOLHDL, LDLDIRECT in  the last 72 hours. ------------------------------------------------------------------------------------------------------------------ No results for input(s): TSH,  T4TOTAL, T3FREE, THYROIDAB in the last 72 hours.  Invalid input(s): FREET3 ------------------------------------------------------------------------------------------------------------------ No results for input(s): VITAMINB12, FOLATE, FERRITIN, TIBC, IRON, RETICCTPCT in the last 72 hours.  Coagulation profile No results for input(s): INR, PROTIME in the last 168 hours.  No results for input(s): DDIMER in the last 72 hours.  Cardiac Enzymes  Recent Labs Lab 06/05/15 2140  TROPONINI 0.04*   ------------------------------------------------------------------------------------------------------------------ Invalid input(s): POCBNP    Assessment & Plan   Active Problems:   Acute respiratory failure with hypercapnia   Sepsis      Code Status Orders        Start     Ordered   06/06/15 1049  Do not attempt resuscitation (DNR)   Continuous    Question Answer Comment  In the event of cardiac or respiratory ARREST Do not call a "code blue"   In the event of cardiac or respiratory ARREST Do not perform Intubation, CPR, defibrillation or ACLS   In the event of cardiac or respiratory ARREST Use medication by any route, position, wound care, and other measures to relive pain and suffering. May use oxygen, suction and manual treatment of airway obstruction as needed for comfort.      06/06/15 1048    Advance Directive Documentation        Most Recent Value   Type of Advance Directive  Living will, Healthcare Power of Gerrit Friends [Pt's daughter [name] Honeycutt]   Pre-existing out of facility DNR order (yellow form or pink MOST form)     "MOST" Form in Place?        1. Acute respiratory failure with hypercapnia: This is likely due to combination of acute on chronic COPD exasperation, as well as recent narcotic treatment for her back pain. I have asked the pulmonary critical care doctor to follow to help with the vent management. I will start her on Solu-Medrol. Continue budesonide and  DuoNeb's. 2.Sepsis, meeting septic criteria by heart rate, leukocytosis, respiratory rate present on arrival. Source cellulitis bilateral lower extremity continue vancomycin  3. Essential hypertension: We will hold Aldactone, Lasix, propranolol given relative hypotension 4. Venous thrombi embolism prophylactic: Heparin subcutaneous  CODE STATUS addressed with daughter Roanna Epley , she states that her mother has living will and would not want to be resuscitated. At this point we will make her DO NOT RESUSCITATE  Consults  pulmonary critical care DVT Prophylaxis  heparin  Lab Results  Component Value Date   PLT 114* 06/06/2015     Time Spent in minutes   55 minutes of critical care time spent Case discussed with daughter and sister   Auburn Bilberry M.D on 06/06/2015 at 1:35 PM  Between 7am to 6pm - Pager - [phone]  After 6pm go to [email or web address] - password [email or web address] [facility]  [facility]   Office  [phone]

## 2015-06-06 NOTE — Consult Note (Signed)
PULMONARY / CRITICAL CARE MEDICINE   Name: Priscilla Powers MRN: [id number] DOB: [birth year]    ADMISSION DATE:  06/05/2015 CONSULTATION DATE:  06/06/15  REFERRING MD :  Dr. Auburn Bilberry   CHIEF COMPLAINT:     Altered Mental status, weakness   HISTORY OF PRESENT ILLNESS  History obtained from Chart - patient intubated and sedated, unable to provide history, history obtain from daughter at bedside.   81 y.o. female with a known history of COPD non-oxygen requiring, essential hypertension presenting with altered mental status. Daughter states that over the past 2-3 weeks patient has had significant pain issues due to worsening compression fractures in her back. She has no history of tumors or cancer that could be causing these compression fractures. She was seen in the ED about 2 weeks ago, was given necrotic regiment for pain management of her compression fractures. Also noted to have bilateral lower extremity swelling with possible cellulitis. Yesterday she was seen in her primary care physician and noted to have been started on Levaquin for possible cellulitis, this is per patient's daughter. Daughter checked on patient last night who was noted to be severely somnolent and not responsive.  Patient was brought to the emergency room, Upon arrival to the emergency department noted be in respiratory distress and markedly somnolent ABG performed reveals elevated CO2 subsequently underwent rapid sequence intubation.  Chart Review by Dr. Dema Severin Primary Pulmonologist - Dr. Meredeth Ide Office Visit - 12/2014 History of Present Illness: Priscilla Powers is a 81 y.o. female copd, stage III, co2 retention, she turned in the trilogy. See sleep on oxygen. On potable oxygen. DOE no worse. No chest pain, mild leg edema, wheezing or cough. She complians of chronic tremor.   Current Medications:  Current Outpatient Prescriptions  Medication Sig Dispense Refill  . acetaminophen (TYLENOL) 500 MG tablet Take 500 mg by  mouth every 6 (six) hours as needed for Pain.  . cholecalciferol (CHOLECALCIFEROL) 1,000 unit tablet Take 2,000 Units by mouth once daily.  . cyanocobalamin (VITAMIN B12) 1000 MCG tablet Take 3,000 mcg by mouth once daily.  . fluticasone-salmeterol (ADVAIR DISKUS) 250-50 mcg/dose diskus inhaler Inhale 1 inhalation into the lungs every 12 (twelve) hours. 1 Inhaler 12  . FUROsemide (LASIX) 20 MG tablet Take 20 mg by mouth 2 (two) times daily.  Marland Kitchen omeprazole (PRILOSEC) 20 MG DR capsule Take 20 mg by mouth once daily.  . potassium chloride (KLOR-CON) 10 MEQ ER tablet Take 10 mEq by mouth once daily.  . propranolol (INDERAL) 40 MG tablet Take 40 mg by mouth once daily.  Marland Kitchen tiotropium (SPIRIVA) 18 mcg inhalation capsule Place 1 capsule (18 mcg total) into inhaler and inhale once daily. 30 capsule 12  . tiotropium-olodaterol (STIOLTO RESPIMAT) 2.5-2.5 mcg/actuation Mist Inhale 2 inhalations into the lungs once daily. 4 g 0   Physical Exam: BP 130/68 mmHg  Pulse 76  Temp(Src) 36.7 C (98.1 F) (Oral)  Wt 62.143 kg (137 lb)  SpO2 94% 62.143 kg (137 lb) 94% General: NAD. Able to speak in complete sentences without cough or dyspnea, in wheel chair HEENT: Normocephalic, nontraumatic. Extraocular movements intact NECK: Supple. No JVD, nodes, thyromegaly CV: RRR no murmurs, gallops, rubs PULM: Normal respiratory effort, Clear to auscultation bilaterally without wheezing or crackles ABD: Soft,  EXTREMITIES: < + 1t edema, no cyanosis or Homans'signs SKIN: Fair turgor. No rashes LYMPHATIC: No nodes NEURO: No gross deficits, tremors PSYCH: Appropriate affect, alert, sponstaneous   Diagnostics: SPIROMETRY: FVC was 1.82 liters, 79% of predicted FEV1  was 0.94, 57% of predicted FEV1 ratio was 51 FEF 25-75% liters per second was 22% of predicted  FLOW VOLUME LOOP: Delayed expiratory flow volume loop  Summary: Moderate-severe obstruction  *Compared to Previous Study, numbers are either consistent or  slightly better.  Impression: 1. COPD, late stage III  - continue present copd regimen (advair and spiriva, albuterol) -oxygen  2. Left pulmonary nodule, appears benign X-ray chest PA and lateral periodically  3. Hypoxia - as per #1  4. DOE (dyspnea on exertion) -as per above -consider pulmonary rehab -follow up in 6 weeks     PAST MEDICAL HISTORY    :  Past Medical History  Diagnosis Date  . COPD (chronic obstructive pulmonary disease)   . CHF (congestive heart failure)   . Hypertension    Past Surgical History  Procedure Laterality Date  . Abdominal hysterectomy     Prior to Admission medications   Medication Sig Start Date End Date Taking? Authorizing Provider  Fluticasone-Salmeterol (ADVAIR) 250-50 MCG/DOSE AEPB Inhale 1 puff into the lungs 2 (two) times daily.   Yes Historical Provider, MD  furosemide (LASIX) 40 MG tablet Take 40 mg by mouth 2 (two) times daily.   Yes Historical Provider, MD  levofloxacin (LEVAQUIN) 750 MG tablet Take 750 mg by mouth daily.   Yes Historical Provider, MD  potassium chloride (K-DUR) 10 MEQ tablet Take 10 mEq by mouth daily.   Yes Historical Provider, MD  propranolol (INDERAL) 40 MG tablet Take 40 mg by mouth daily.   Yes Historical Provider, MD  spironolactone (ALDACTONE) 25 MG tablet Take 25 mg by mouth daily.   Yes Historical Provider, MD  tiotropium (SPIRIVA) 18 MCG inhalation capsule Place 18 mcg into inhaler and inhale daily.   Yes Historical Provider, MD  nitrofurantoin, macrocrystal-monohydrate, (MACROBID) 100 MG capsule Take 1 capsule (100 mg total) by mouth 2 (two) times daily. Patient not taking: Reported on 06/05/2015 05/13/15   Irean Hong, MD  oxyCODONE-acetaminophen (ROXICET) 5-325 MG per tablet Take 1 tablet by mouth every 8 (eight) hours as needed. Patient not taking: Reported on 06/05/2015 05/15/15 05/14/16  Jene Every, MD   Allergies  Allergen Reactions  . Penicillins Other (See Comments)    Reaction:  Unknown       FAMILY HISTORY   Family History  Problem Relation Age of Onset  . Hypertension Other       SOCIAL HISTORY   Tobacco use-quit several years ago, per daughter smoked about a half pack per day for about 30 years ROS-unable to obtain patient intubated and sedated on fentanyl and Versed    VITAL SIGNS    Temp:  [95.9 F (35.5 C)-97.4 F (36.3 C)] 97.3 F (36.3 C) (08/13 0800) Pulse Rate:  [28-127] 127 (08/13 0800) Resp:  [14-35] 14 (08/13 0800) BP: (54-205)/(33-159) 81/33 mmHg (08/13 0800) SpO2:  [65 %-100 %] 100 % (08/13 0800) FiO2 (%):  [30 %-50 %] 30 % (08/13 0821) Weight:  [125 lb 3.5 oz (56.799 kg)] 125 lb 3.5 oz (56.799 kg) (08/12 2033) HEMODYNAMICS:   VENTILATOR SETTINGS: Vent Mode:  [-] PRVC FiO2 (%):  [30 %-50 %] 30 % Set Rate:  [14 bmp-16 bmp] 14 bmp Vt Set:  [450 mL] 450 mL PEEP:  [5 cmH20] 5 cmH20 INTAKE / OUTPUT:  Intake/Output Summary (Last 24 hours) at 06/06/15 1114 Last data filed at 06/06/15 0556  Gross per 24 hour  Intake      0 ml  Output    450  ml  Net   -450 ml       PHYSICAL EXAM   Physical Exam  GENERAL: Intubated/sedated, no acute distress on the ventilator HEAD: Normocephalic, atraumatic.  EYES: Pupils equal, round, reactive to light. Unable to assess extraocular muscles given mental status/medical condition. No scleral icterus.  MOUTH: Moist mucosal membrane. Dentition intact. No abscess noted.  EAR, NOSE, THROAT: Clear without exudates. No external lesions.  NECK: Supple. No thyromegaly. No nodules. No JVD.  PULMONARY: Coarse breath sounds, without wheeze rails or rhonci. No use of accessory muscles, intubated/mechanically ventilated CHEST: Nontender to palpation.  CARDIOVASCULAR: S1 and S2. Regular rate and rhythm. No murmurs, rubs, or gallops. 1+ edema. Pedal pulses 2+ bilaterally.  GASTROINTESTINAL: Soft, nontender, nondistended. No masses. Positive bowel sounds. No hepatosplenomegaly.  MUSCULOSKELETAL: No  swelling, clubbing, or edema. Range of motion full in all extremities.  NEUROLOGIC: Unable to assess given mental status/medical condition SKIN: Bilateral lower extremity erythema right lower extremity worse than left to mid shin, multiple ulcerations both lower extremities without discharge, ulcerations are mostly superficial PSYCHIATRIC: Unable to assess given mental status/medical condition   LABS   LABS:  CBC  Recent Labs Lab 06/05/15 2026 06/06/15 0240  WBC 14.0* 8.8  HGB 14.5 11.1*  HCT 46.0 35.9  PLT 181 114*   Coag's No results for input(s): APTT, INR in the last 168 hours. BMET  Recent Labs Lab 06/05/15 2140 06/06/15 0240  NA 148* 148*  K 5.0 4.5  CL 107 113*  CO2 34* 30  BUN 57* 52*  CREATININE 1.59* 1.45*  GLUCOSE 105* 103*   Electrolytes  Recent Labs Lab 06/05/15 2140 06/06/15 0240  CALCIUM 13.3* 11.4*   Sepsis Markers  Recent Labs Lab 06/05/15 2131 06/05/15 2249 06/06/15 0240  LATICACIDVEN 1.5 1.1 2.3*   ABG  Recent Labs Lab 06/05/15 2127 06/05/15 2326  PHART 7.18* 7.52*  PCO2ART 116* 38  PO2ART 97 225*   Liver Enzymes  Recent Labs Lab 06/05/15 2140 06/06/15 0240  AST 16 18  ALT 18 14  ALKPHOS 106 76  BILITOT 1.3* 0.9  ALBUMIN 3.6 2.6*   Cardiac Enzymes  Recent Labs Lab 06/05/15 2140  TROPONINI 0.04*   Glucose  Recent Labs Lab 06/06/15 0159  GLUCAP 78     Recent Results (from the past 240 hour(s))  MRSA PCR Screening     Status: Abnormal   Collection Time: 06/05/15  8:16 PM  Result Value Ref Range Status   MRSA by PCR POSITIVE (A) NEGATIVE Final    Comment:        The GeneXpert MRSA Assay (FDA approved for NASAL specimens only), is one component of a comprehensive MRSA colonization surveillance program. It is not intended to diagnose MRSA infection nor to guide or monitor treatment for MRSA infections. CRITICAL RESULT CALLED TO, READ BACK BY AND VERIFIED WITH: CHRISTY AUSTIN AT 9811 06/06/15 SDR    Wound culture     Status: None (Preliminary result)   Collection Time: 06/05/15  8:54 PM  Result Value Ref Range Status   Specimen Description ABSCESS  Final   Special Requests Normal  Final   Gram Stain PENDING  Incomplete   Culture   Final    HEAVY GROWTH STAPHYLOCOCCUS AUREUS SUSCEPTIBILITIES TO FOLLOW    Report Status PENDING  Incomplete     Current facility-administered medications:  .  0.9 %  sodium chloride infusion, , Intravenous, Continuous, Wyatt Haste, MD, Last Rate: 100 mL/hr at 06/06/15 0023 .  acetaminophen (  TYLENOL) tablet 650 mg, 650 mg, Oral, Q6H PRN **OR** acetaminophen (TYLENOL) suppository 650 mg, 650 mg, Rectal, Q6H PRN, Wyatt Haste, MD .  fentaNYL in NS (12mcg/ml) infusion-PREMIX, 10 mcg/hr, Intravenous, Continuous, Leona Carry, MD, Last Rate: 15 mL/hr at 06/06/15 0217, 150 mcg/hr at 06/06/15 0217 .  heparin injection 5,000 Units, 5,000 Units, Subcutaneous, 3 times per day, Wyatt Haste, MD, 5,000 Units at 06/06/15 [phone] .  ipratropium-albuterol (DUONEB) 0.5-2.5 (3) MG/3ML nebulizer solution 3 mL, 3 mL, Nebulization, Q4H, Wyatt Haste, MD, 3 mL at 06/06/15 0820 .  methylPREDNISolone sodium succinate (SOLU-MEDROL) 125 mg/2 mL injection 60 mg, 60 mg, Intravenous, Q6H, Auburn Bilberry, MD, 60 mg at 06/06/15 1059 .  midazolam (VERSED) 50 mg in sodium chloride 0.9 % 50 mL (1 mg/mL) infusion, 1 mg/hr, Intravenous, Continuous, Leona Carry, MD, Last Rate: 2 mL/hr at 06/06/15 0800, 2 mg/hr at 06/06/15 0800 .  mometasone-formoterol (DULERA) 100-5 MCG/ACT inhaler 2 puff, 2 puff, Inhalation, BID, Wyatt Haste, MD .  morphine 2 MG/ML injection 2 mg, 2 mg, Intravenous, Q4H PRN, Wyatt Haste, MD .  ondansetron [facility]) tablet 4 mg, 4 mg, Oral, Q6H PRN **OR** ondansetron (ZOFRAN) injection 4 mg, 4 mg, Intravenous, Q6H PRN, Wyatt Haste, MD .  oxyCODONE (Oxy IR/ROXICODONE) immediate release tablet 5 mg, 5 mg, Oral, Q4H PRN, Wyatt Haste, MD .  sodium  chloride 0.9 % bolus 1,000 mL, 1,000 mL, Intravenous, Once, Wyatt Haste, MD, 1,000 mL at 06/06/15 0043 .  sodium chloride 0.9 % injection 3 mL, 3 mL, Intravenous, Q12H, Wyatt Haste, MD, 3 mL at 06/06/15 1000 .  tiotropium (SPIRIVA) inhalation capsule 18 mcg, 18 mcg, Inhalation, Daily, Wyatt Haste, MD .  vancomycin (VANCOCIN) IVPB 750 mg/150 ml premix, 750 mg, Intravenous, Q36H, Wyatt Haste, MD  IMAGING    Dg Chest 1 View  06/05/2015   CLINICAL DATA:  Endotracheal tube placement.  Initial encounter.  EXAM: CHEST  1 VIEW  COMPARISON:  Chest radiograph performed earlier today at 8:45 p.m.  FINDINGS: The patient's endotracheal tube is seen ending 4 cm above the carina. An enteric tube is noted extending below the diaphragm.  The lungs appear clear bilaterally. No focal consolidation, pleural effusion or pneumothorax is seen. Mild scarring is suggested at the right lung apex, though this may reflect overlying tubing.  The cardiomediastinal silhouette is borderline enlarged. No acute osseous abnormalities are identified. A large paraesophageal hernia is again noted.  IMPRESSION: 1. Endotracheal tube seen ending 4 cm above the carina. 2. Lungs clear bilaterally. 3. Borderline cardiomegaly. 4. Large paraesophageal hernia again noted.   Electronically Signed   By: Roanna Raider M.D.   On: 06/05/2015 23:57   Dg Chest 1 View  06/05/2015   CLINICAL DATA:  Altered mental status. Weakness. Shortness of breath.  EXAM: CHEST  1 VIEW  COMPARISON:  One-view chest x-ray 02/22/2014.  FINDINGS: Cardiomegaly is again noted. Lung volumes are low. This is a reversed lordotic view. No focal airspace disease is evident. The visualized soft tissues and bony thorax are unremarkable.  IMPRESSION: 1. Stable cardiomegaly without failure.   Electronically Signed   By: Marin Roberts M.D.   On: 06/05/2015 21:22   Ct Head Wo Contrast  06/06/2015   CLINICAL DATA:  Acute onset of altered mental status. High blood  pressure. Initial encounter.  EXAM: CT HEAD WITHOUT CONTRAST  TECHNIQUE: Contiguous axial images were obtained from the base of the  skull through the vertex without intravenous contrast.  COMPARISON:  None.  FINDINGS: There is no evidence of acute infarction, mass lesion, or intra- or extra-axial hemorrhage on CT.  Prominence of the ventricles and sulci likely reflects mild cortical volume loss. Scattered periventricular and subcortical white matter change reflects small vessel ischemic microangiopathy. Mild cerebellar atrophy is noted. Chronic ischemic change is noted at the external capsule bilaterally.  The brainstem and fourth ventricle are within normal limits. The basal ganglia are unremarkable in appearance. The cerebral hemispheres demonstrate grossly normal gray-white differentiation. No mass effect or midline shift is seen.  There is no evidence of fracture; visualized osseous structures are unremarkable in appearance. The visualized portions of the orbits are within normal limits. The paranasal sinuses and mastoid air cells are well-aerated. No significant soft tissue abnormalities are seen.  IMPRESSION: 1. No acute intracranial pathology seen on CT. 2. Mild cortical volume loss and scattered small vessel ischemic microangiopathy.   Electronically Signed   By: Roanna Raider M.D.   On: 06/06/2015 02:15         ASSESSMENT/PLAN  81 year old female past medical history of stage III COPD admitted for severe altered mental status/somnolence/cellulitis, and respiratory failure, intubated in the ED and transferred to the ICU.  PULMONARY  A: Acute on chronic hypercapnic respiratory failure AE COPD CO2 narcosis P:   Continue with mechanical ventilation, wean as tolerated Spontaneous breathing trials daily Monitor sedation closely especially given soft blood pressures Maintain O2 saturations greater than 88% Continue with nebulizers, steroids, broncho- dilators Limit the use of narcotics and  benzodiazepine's  CARDIOVASCULAR CVL-none A: Hypotension P:  Monitor sedation and analgesia Maintain SBP greater than 90 Maintain map greater than 65 Hold on any antihypertensives medication Give gentle bolus of 250 mL normal saline DVT Prophylaxis  RENAL A:  Acute kidney injury P:   Most likely related to hypertension Continue with gentle IV fluids, and gentle fluid boluses Monitor electrolytes ICV electrolyte replacement protocol  GASTROINTESTINAL P:   Continue with GI prophylaxis   INFECTIOUS A:  Bilateral lower extremity cellulitis P:   BCx2  UC  Sputum Abx: Vanc, start date8/12 day 1/  NEUROLOGIC A:  Altered mental status/somnolence P:   Secondary to CO2 narcosis Continue with COPD management, monitor ABG, monitor neuro status, RASS 0 to -1  Family updated at bedside   I have personally obtained a history, examined the patient, evaluated laboratory and imaging results, formulated the assessment and plan and placed orders.  The Patient requires high complexity decision making for assessment and support, frequent evaluation and titration of therapies, application of advanced monitoring technologies and extensive interpretation of multiple databases. Critical Care Time devoted to patient care services described in this note is 35 minutes.   Overall, patient is critically ill, prognosis is guarded. Patient at high risk for cardiac arrest and death.   Stephanie Acre, MD [facility] Pager [phone] (Please enter [phone]-digits)     06/06/2015, 11:14 AM

## 2015-06-06 NOTE — Progress Notes (Signed)
Patient initiated on Levaquin  IVPB q24hrs for cellulitis.  Per UpToDate, patient's renal function qualifies her for a dose reduction to Levaquin  IV x1 followed by Levaquin  IV q24hrs.   Estimated Creatinine Clearance: 24.3 mL/min (by C-G formula based on Cr of 1.45).  Dose adjusted accordingly.  Roque Cash, PharmD Clinical Pharmacist

## 2015-06-06 NOTE — Progress Notes (Signed)
Initial Nutrition Assessment  INTERVENTION:   EN: due to severe sepsis with hypotension, recommend starting trophic feedings of Vital 1.2 only. Will assess tolerance and ability to titrate TF on follow-up   NUTRITION DIAGNOSIS:   Inadequate oral intake related to acute illness as evidenced by NPO status.  GOAL:   Provide needs based on ASPEN/SCCM guidelines   MONITOR:    (Energy Intake, EN, Digestive System, Electrolyte/Renal Profile, Glucose Profile, Pulmonary)  REASON FOR ASSESSMENT:   Consult Enteral/tube feeding initiation and management  ASSESSMENT:    Pt admitted with AMS, severe sepsis with hypotension, respiratory failure requiring intubation; currently sedated on vent   Past Medical History  Diagnosis Date  . COPD (chronic obstructive pulmonary disease)   . CHF (congestive heart failure)   . Hypertension     Past Surgical History  Procedure Laterality Date  . Abdominal hysterectomy      Diet Order:  Diet NPO time specified Except for: Sips with Meds   Digestive System: OG in place, abdomen distended, BS active  Electrolyte and Renal Profile:  Recent Labs Lab 06/05/15 2140 06/06/15 0240  BUN 57* 52*  CREATININE 1.59* 1.45*  NA 148* 148*  K 5.0 4.5   Glucose Profile:   Recent Labs  06/06/15 0159  GLUCAP 78   Protein Profile:   Recent Labs Lab 06/05/15 2140 06/06/15 0240  ALBUMIN 3.6 2.6*   Meds: NS at 100 ml/hr, solumedrol  Skin:  Reviewed, no issues  Last BM:  8/12  Height:   Ht Readings from Last 1 Encounters:  06/05/15  (1.6 m)    Weight:   Wt Readings from Last 1 Encounters:  06/05/15 125 lb 3.5 oz (56.799 kg)   BMI:  Body mass index is 22.19 kg/(m^2).  Estimated Nutritional Needs:   Kcal:  1438 kcals (ve: 6.6, Tmax: 38)  Protein:  86-114 g(1.5-2.0 g/kg)   Fluid:  1425-1710 mL (25-30 ml/kg)     HIGH Care Level  Romelle Starcher MS, RD, LDN [phone] Pager

## 2015-06-07 DIAGNOSIS — E870 Hyperosmolality and hypernatremia: Secondary | ICD-10-CM

## 2015-06-07 DIAGNOSIS — J9612 Chronic respiratory failure with hypercapnia: Secondary | ICD-10-CM

## 2015-06-07 LAB — BLOOD GAS, ARTERIAL
ACID-BASE EXCESS: 0 mmol/L (ref 0.0–3.0)
Allens test (pass/fail): POSITIVE — AB
Bicarbonate: 25.4 mEq/L (ref 21.0–28.0)
FIO2: 30
MECHANICAL RATE: 14
O2 Saturation: 97.5 %
PATIENT TEMPERATURE: 37
PCO2 ART: 43 mmHg (ref 32.0–48.0)
PEEP: 5 cmH2O
PH ART: 7.38 (ref 7.350–7.450)
PO2 ART: 98 mmHg (ref 83.0–108.0)
VT: 450 mL

## 2015-06-07 LAB — CBC
HEMATOCRIT: 29.3 % — AB (ref 35.0–47.0)
Hemoglobin: 9.3 g/dL — ABNORMAL LOW (ref 12.0–16.0)
MCH: 29.9 pg (ref 26.0–34.0)
MCHC: 31.8 g/dL — AB (ref 32.0–36.0)
MCV: 93.9 fL (ref 80.0–100.0)
Platelets: 85 10*3/uL — ABNORMAL LOW (ref 150–440)
RBC: 3.12 MIL/uL — ABNORMAL LOW (ref 3.80–5.20)
RDW: 17.7 % — AB (ref 11.5–14.5)
WBC: 6.9 10*3/uL (ref 3.6–11.0)

## 2015-06-07 LAB — BASIC METABOLIC PANEL
Anion gap: 4 — ABNORMAL LOW (ref 5–15)
BUN: 48 mg/dL — ABNORMAL HIGH (ref 6–20)
CO2: 26 mmol/L (ref 22–32)
Calcium: 10.6 mg/dL — ABNORMAL HIGH (ref 8.9–10.3)
Chloride: 116 mmol/L — ABNORMAL HIGH (ref 101–111)
Creatinine, Ser: 1.76 mg/dL — ABNORMAL HIGH (ref 0.44–1.00)
GFR calc non Af Amer: 26 mL/min — ABNORMAL LOW (ref >60)
GFR, EST AFRICAN AMERICAN: 30 mL/min — AB (ref >60)
GLUCOSE: 116 mg/dL — AB (ref 65–99)
POTASSIUM: 4.3 mmol/L (ref 3.5–5.1)
Sodium: 146 mmol/L — ABNORMAL HIGH (ref 135–145)

## 2015-06-07 LAB — GLUCOSE, CAPILLARY
GLUCOSE-CAPILLARY: 103 mg/dL — AB (ref 65–99)
GLUCOSE-CAPILLARY: 174 mg/dL — AB (ref 65–99)

## 2015-06-07 MED ORDER — CHLORHEXIDINE GLUCONATE 0.12 % MT SOLN
15.0000 mL | Freq: Two times a day (BID) | OROMUCOSAL | Status: DC
  Administered 2015-06-07 – 2015-06-13 (×10): 15 mL via OROMUCOSAL

## 2015-06-07 MED ORDER — FENTANYL CITRATE (PF) 100 MCG/2ML IJ SOLN
50.0000 ug | INTRAMUSCULAR | Status: DC | PRN
  Administered 2015-06-07 – 2015-06-09 (×7): 50 ug via INTRAVENOUS
  Filled 2015-06-07: qty 2

## 2015-06-07 MED ORDER — VITAL AF 1.2 CAL PO LIQD
1000.0000 mL | ORAL | Status: DC
  Administered 2015-06-07 – 2015-06-08 (×2): 1000 mL

## 2015-06-07 MED ORDER — DEXMEDETOMIDINE HCL IN NACL 400 MCG/100ML IV SOLN
0.0000 ug/kg/h | INTRAVENOUS | Status: DC
  Administered 2015-06-07 (×2): 1 ug/kg/h via INTRAVENOUS
  Administered 2015-06-07: 0.8 ug/kg/h via INTRAVENOUS
  Administered 2015-06-08: 1 ug/kg/h via INTRAVENOUS
  Administered 2015-06-08: 0.8 ug/kg/h via INTRAVENOUS
  Administered 2015-06-08 (×2): 1 ug/kg/h via INTRAVENOUS
  Filled 2015-06-07 (×8): qty 100

## 2015-06-07 MED ORDER — FENTANYL CITRATE (PF) 100 MCG/2ML IJ SOLN
50.0000 ug | INTRAMUSCULAR | Status: AC | PRN
  Administered 2015-06-07 – 2015-06-09 (×3): 50 ug via INTRAVENOUS
  Filled 2015-06-07 (×8): qty 2

## 2015-06-07 MED ORDER — CETYLPYRIDINIUM CHLORIDE 0.05 % MT LIQD
7.0000 mL | Freq: Two times a day (BID) | OROMUCOSAL | Status: DC
  Administered 2015-06-08 – 2015-06-10 (×5): 7 mL via OROMUCOSAL

## 2015-06-07 NOTE — Progress Notes (Signed)
Pt became agitated and started shaking despite no stimulation.  Facial grimacing and tension noticed.  HR increased to 163bpm.  Fentanyl IV push given.  HR returned to 78. Patient appears calm.  Will continue to monitor.

## 2015-06-07 NOTE — Progress Notes (Addendum)
PULMONARY / CRITICAL CARE MEDICINE   Name: Priscilla Powers MRN: [id number] DOB: 11-12-1929    ADMISSION DATE: 06/05/2015 CONSULTATION DATE: 06/06/15  REFERRING MD : Dr. Auburn Bilberry  CHIEF COMPLAINT:  Altered Mental status, weakness   HISTORY OF PRESENT ILLNESS:  Pulmonologist - Dr. Meredeth Ide 81 y.o. female with a known history of COPD non-oxygen requiring, essential hypertension presenting with altered mental status. Daughter states that over the past 2-3 weeks patient has had significant pain issues due to worsening compression fractures in her back. She has no history of tumors or cancer that could be causing these compression fractures. She was seen in the ED about 2 weeks ago, was given necrotic regiment for pain management of her compression fractures. Also noted to have bilateral lower extremity swelling with possible cellulitis. Yesterday she was seen in her primary care physician and noted to have been started on Levaquin for possible cellulitis, this is per patient's daughter. Daughter checked on patient last night who was noted to be severely somnolent and not responsive. Patient was brought to the emergency room, Upon arrival to the emergency department noted be in respiratory distress and markedly somnolent ABG performed reveals elevated CO2 subsequently underwent rapid sequence intubation.   SUBJECTIVE:  No acute events overnight.  During SBT this morning, noted to have moderate to severe agitation, not following commands.  VITAL SIGNS: Temp:  [98.2 F (36.8 C)-100.4 F (38 C)] 98.6 F (37 C) (08/14 0800) Pulse Rate:  [51-121] 65 (08/14 0800) Resp:  [14-22] 14 (08/14 0800) BP: (78-141)/(35-67) 115/48 mmHg (08/14 0800) SpO2:  [100 %] 100 % (08/14 0800) FiO2 (%):  [30 %] 30 % (08/14 0822) Weight:  [137 lb 9.1 oz (62.4 kg)] 137 lb 9.1 oz (62.4 kg) (08/14 0230) HEMODYNAMICS:   VENTILATOR SETTINGS: Vent Mode:  [-] PRVC FiO2 (%):  [30 %] 30 % Set Rate:  [14 bmp] 14 bmp Vt  Set:  [450 mL] 450 mL PEEP:  [5 cmH20] 5 cmH20 INTAKE / OUTPUT:  Intake/Output Summary (Last 24 hours) at 06/07/15 0957 Last data filed at 06/07/15 0700  Gross per 24 hour  Intake 793.48 ml  Output    600 ml  Net 193.48 ml    PHYSICAL EXAMINATION: General:  Moderately agitated on the vent, but somnolent at times Neuro:  RASS: 1-3 HEENT:  [place]/AT, no lesions Cardiovascular:  S1, S2, no murmurs Lungs:  Coarse upper airway sounds, fine exp wheezes at the bases Abdomen:  Soft, nt/nd Musculoskeletal:  Warm, FROM Skin:  Warm, superficial ulcers on the b\l LE.   LABS:  CBC  Recent Labs Lab 06/05/15 2026 06/06/15 0240 06/07/15 0315  WBC 14.0* 8.8 6.9  HGB 14.5 11.1* 9.3*  HCT 46.0 35.9 29.3*  PLT 181 114* 85*   Coag's No results for input(s): APTT, INR in the last 168 hours. BMET  Recent Labs Lab 06/05/15 2140 06/06/15 0240 06/07/15 0315  NA 148* 148* 146*  K 5.0 4.5 4.3  CL 107 113* 116*  CO2 34* 30 26  BUN 57* 52* 48*  CREATININE 1.59* 1.45* 1.76*  GLUCOSE 105* 103* 116*   Electrolytes  Recent Labs Lab 06/05/15 2140 06/06/15 0240 06/07/15 0315  CALCIUM 13.3* 11.4* 10.6*   Sepsis Markers  Recent Labs Lab 06/05/15 2131 06/05/15 2249 06/06/15 0240  LATICACIDVEN 1.5 1.1 2.3*   ABG  Recent Labs Lab 06/05/15 2127 06/05/15 2326  PHART 7.18* 7.52*  PCO2ART 116* 38  PO2ART 97 225*   Liver Enzymes  Recent Labs  Lab 06/05/15 2140 06/06/15 0240  AST 16 18  ALT 18 14  ALKPHOS 106 76  BILITOT 1.3* 0.9  ALBUMIN 3.6 2.6*   Cardiac Enzymes  Recent Labs Lab 06/05/15 2140  TROPONINI 0.04*   Glucose  Recent Labs Lab 06/06/15 0159 06/06/15 1853 06/06/15 2148 06/07/15 0034  GLUCAP 78 89 116* 103*    Imaging No results found.   ASSESSMENT / PLAN: 81 year old female past medical history of stage III COPD admitted for severe altered mental status/somnolence/cellulitis, and respiratory failure, intubated in the ED and transferred to the  ICU.  PULMONARY  A: Acute on chronic hypercapnic respiratory failure AE COPD CO2 narcosis P:  Continue with mechanical ventilation, wean as tolerated Spontaneous breathing trials daily Monitor sedation closely especially given soft blood pressures - bp improving today Maintain O2 saturations greater than 88% Continue with nebulizers, steroids, broncho- dilators Limit the use of narcotics and benzodiazepine's Check ABG today  CARDIOVASCULAR CVL-none A: Hypotension P:  Monitor sedation and analgesia Maintain SBP greater than 90 Maintain map greater than 65 Hold on any antihypertensives medication Give gentle bolus of 250 mL normal saline DVT Prophylaxis  RENAL A: Acute kidney injury - slightly worst today Hypernatremia - improving P:  Most likely related to hypotension Continue with gentle IV fluids, and gentle fluid boluses - cut NS to 50cc Monitor electrolytes ICU electrolyte replacement protocol  GASTROINTESTINAL P:  Continue with GI prophylaxis Cont with TF   INFECTIOUS A: Bilateral lower extremity cellulitis P:  BCx2 - NTD UC  Sputum - pending Abx: Vanc, start date 8/12 day 2/ Leva, start date 8/14 day 1/ NEUROLOGIC A: Altered mental status/somnolence P:  Secondary to CO2 narcosis Continue with COPD management, monitor ABG, monitor neuro status, RASS 0 to -1 precedex and prn Fentanyl while on the vent  Family updated at bedside  Patient to follow up with Dr. Meredeth Ide after discharge.   I have personally obtained a history, examined the patient, evaluated laboratory and imaging results, formulated the assessment and plan and placed orders.  The Patient requires high complexity decision making for assessment and support, frequent evaluation and titration of therapies, application of advanced monitoring technologies and extensive interpretation of multiple databases. Critical Care Time devoted to patient care services described in this  note is 35 minutes.   Overall, patient is critically ill, prognosis is guarded. Patient at high risk for cardiac arrest and death.    Stephanie Acre, MD [facility] Pager [phone] (Please enter 7-digits)

## 2015-06-07 NOTE — Progress Notes (Signed)
Patient initiated on Levaquin  IVPB q24hrs for cellulitis.  Per UpToDate, patient's renal function qualifies her for a dose reduction to Levaquin  IV x1 followed by Levaquin  IV q24hrs.   Estimated Creatinine Clearance: 20 mL/min (by C-G formula based on Cr of 1.76).  Dose adjusted accordingly.  Demetrius Charity, PharmD  Clinical Pharmacist

## 2015-06-07 NOTE — Progress Notes (Signed)
At approx 2000 the pts sister tried to wake her and speak to her, pt became visibly agitated and HR increased to around 142 bpm. Spoke with family about trying to keep stimulation to a minimum to avoid agitation. Family is agreeable, pleasant and supportive of pt needs.

## 2015-06-07 NOTE — Progress Notes (Signed)
[facility] [place] at [facility]                                                                                                                                                                                            Patient Demographics   Priscilla Powers, is a 81 y.o. female, DOB - 1930-02-23, ZOX:[id number]  Admit date - 06/05/2015   Admitting Physician Wyatt Haste, MD  Outpatient Primary MD for the patient is No primary care provider on file.   LOS - 2  Subjective: Patient became agitated last night when her sedation was decreased. Remains on the ventilator [facility] - [place] at [facility]                                                                                                                                                                                            Patient Demographics   Priscilla Powers, is a 81 y.o. female, DOB - Jun 14, 1930, WJX:[id number]  Admit date - 06/05/2015   Admitting Physician Wyatt Haste, MD  Outpatient Primary MD for the patient is No primary care provider on file.   LOS - 2  Subjective: Patient became very agitated last night currently fully sedated and on the vent   Review of Systems:   CONSTITUTIONAL: No documented fever. Unable to provide any further review of systems due to patient being on the ventilator  Vitals:   Filed Vitals:   06/07/15 0800 06/07/15 0900 06/07/15 1000 06/07/15 1100  BP: 115/48 115/86 114/60 107/44  Pulse: 65 76 83 75  Temp: 98.6 F (37 C) 98.2 F (36.8 C) 98.4 F (36.9 C) 99 F (37.2 C)  TempSrc:      Resp: 14 18 16  14  Height:      Weight:      SpO2: 100% 100% 100% 100%    Wt Readings from Last 3 Encounters:  06/07/15 62.4 kg (137 lb 9.1 oz)  05/15/15 61.689 kg (136 lb)  05/12/15 62.143 kg (137 lb)     Intake/Output Summary (Last 24 hours) at 06/07/15 1252 Last data filed at 06/07/15 0700  Gross per 24 hour  Intake 793.48 ml  Output    600  ml  Net 193.48 ml    Physical Exam:   GENERAL: Critically ill-appearing female on the ventilator  HEAD, EYES, EARS, NOSE AND THROAT: Atraumatic, normocephalic. . Pupils equal and reactive to light. Sclerae anicteric. No conjunctival injection. No oro-pharyngeal erythema.  NECK: Supple. There is no jugular venous distention. No bruits, no lymphadenopathy, no thyromegaly.  HEART: Regular rate and rhythm,. No murmurs, no rubs, no clicks.  LUNGS: Clear to auscultation bilaterally. No rales or rhonchi. No wheezes.  ABDOMEN: Soft, flat, nontender, nondistended. Has good bowel sounds. No hepatosplenomegaly appreciated.  EXTREMITIES: No evidence of any cyanosis, clubbing, or peripheral edema.  +2 pedal and radial pulses bilaterally.  NEUROLOGIC: Sedated on the ventilator SKIN: Bilateral lower extremity erythema and warmth Psych: Sedated and on the ventilator LN: No inguinal LN enlargement    Antibiotics   Anti-infectives    Start     Dose/Rate Route Frequency Ordered Stop   06/07/15 1415  Levofloxacin (LEVAQUIN) IVPB 250 mg     250 mg 50 mL/hr over 60 Minutes Intravenous Every 24 hours 06/06/15 1412     06/06/15 2300  vancomycin (VANCOCIN) IVPB 750 mg/150 ml premix     750 mg 150 mL/hr over 60 Minutes Intravenous Every 36 hours 06/06/15 0037     06/06/15 1415  levofloxacin (LEVAQUIN) IVPB 500 mg     500 mg 100 mL/hr over 60 Minutes Intravenous  Once 06/06/15 1412 06/06/15 1543   06/06/15 1345  levofloxacin (LEVAQUIN) IVPB 500 mg  Status:  Discontinued     500 mg 100 mL/hr over 60 Minutes Intravenous Every 24 hours 06/06/15 1344 06/06/15 1410   06/06/15 0000  vancomycin (VANCOCIN) IVPB 1000 mg/200 mL premix  Status:  Discontinued     1,000 mg 200 mL/hr over 60 Minutes Intravenous  Once 06/05/15 2357 06/05/15 2357   06/05/15 2330  vancomycin (VANCOCIN) IVPB 1000 mg/200 mL premix     1,000 mg 200 mL/hr over 60 Minutes Intravenous  Once 06/05/15 2316 06/06/15 0133      Medications    Scheduled Meds: . budesonide (PULMICORT) nebulizer solution  0.25 mg Nebulization BID  . feeding supplement (VITAL AF 1.2 CAL)  1,000 mL Per Tube Q24H  . free water  25 mL Per Tube 6 times per day  . heparin  5,000 Units Subcutaneous 3 times per day  . ipratropium-albuterol  3 mL Nebulization Q6H  . levofloxacin (LEVAQUIN) IV  250 mg Intravenous Q24H  . methylPREDNISolone (SOLU-MEDROL) injection  60 mg Intravenous Q6H  . sodium chloride  1,000 mL Intravenous Once  . sodium chloride  3 mL Intravenous Q12H  . vancomycin  750 mg Intravenous Q36H   Continuous Infusions: . sodium chloride 50 mL/hr at 06/07/15 1034  . dexmedetomidine 1 mcg/kg/hr (06/07/15 1031)   PRN Meds:.acetaminophen **OR** acetaminophen, fentaNYL (SUBLIMAZE) injection, fentaNYL (SUBLIMAZE) injection, ondansetron **OR** ondansetron (ZOFRAN) IV   Data Review:   Micro Results Recent Results (from the past 240 hour(s))  MRSA PCR Screening     Status: Abnormal  Collection Time: 06/05/15  8:16 PM  Result Value Ref Range Status   MRSA by PCR POSITIVE (A) NEGATIVE Final    Comment:        The GeneXpert MRSA Assay (FDA approved for NASAL specimens only), is one component of a comprehensive MRSA colonization surveillance program. It is not intended to diagnose MRSA infection nor to guide or monitor treatment for MRSA infections. CRITICAL RESULT CALLED TO, READ BACK BY AND VERIFIED WITH: CHRISTY AUSTIN AT 0443 06/06/15 SDR   Wound culture     Status: None (Preliminary result)   Collection Time: 06/05/15  8:54 PM  Result Value Ref Range Status   Specimen Description ABSCESS  Final   Special Requests Normal  Final   Gram Stain RARE WBC SEEN FEW GRAM POSITIVE COCCI IN PAIRS   Final   Culture   Final    HEAVY GROWTH METHICILLIN RESISTANT STAPHYLOCOCCUS AUREUS CRITICAL RESULT CALLED TO, READ BACK BY AND VERIFIED WITH: ADAM SCARBOROUGH AT 1126 06/07/15 DV    Report Status PENDING  Incomplete   Organism ID,  Bacteria METHICILLIN RESISTANT STAPHYLOCOCCUS AUREUS  Final      Susceptibility   Methicillin resistant staphylococcus aureus - MIC*    CIPROFLOXACIN >=8 RESISTANT Resistant     GENTAMICIN <=0.5 SENSITIVE Sensitive     OXACILLIN >=4 RESISTANT Resistant     VANCOMYCIN <=0.5 SENSITIVE Sensitive     TRIMETH/SULFA <=10 SENSITIVE Sensitive     CEFOXITIN SCREEN Value in next row Resistant      POSITIVECEFOXITIN SCREEN - This test may be used to predict mecA-mediated oxacillin resistance, and it is based on the cefoxitin disk screen test.  The cefoxitin screen and oxacillin work in combination to determine the final interpretation reported for oxacillin.     Inducible Clindamycin Value in next row Resistant      POSITIVEINDUCIBLE CLINDAMYCIN RESISTANCE - A positive ICR test is indicative of inducible resistance to macrolides, lincosamides, and type B streptogramin.  This isolate is presumed to be resistant to Clindamycin, however, Clindamycin may still be effective in some patients.     TETRACYCLINE Value in next row Sensitive      SENSITIVE<=1    * HEAVY GROWTH METHICILLIN RESISTANT STAPHYLOCOCCUS AUREUS  Blood culture (routine x 2)     Status: None (Preliminary result)   Collection Time: 06/05/15  9:27 PM  Result Value Ref Range Status   Specimen Description BLOOD  Final   Special Requests BOTTLES DRAWN AEROBIC AND ANAEROBIC 4CC  Final   Culture NO GROWTH 2 DAYS  Final   Report Status PENDING  Incomplete  Blood culture (routine x 2)     Status: None (Preliminary result)   Collection Time: 06/05/15 10:49 PM  Result Value Ref Range Status   Specimen Description BLOOD  Final   Special Requests BOTTLES DRAWN AEROBIC AND ANAEROBIC 2CC  Final   Culture NO GROWTH 2 DAYS  Final   Report Status PENDING  Incomplete  Culture, respiratory (NON-Expectorated)     Status: None (Preliminary result)   Collection Time: 06/06/15 12:40 PM  Result Value Ref Range Status   Specimen Description TRACHEAL ASPIRATE   Final   Special Requests NONE  Final   Gram Stain PENDING  Incomplete   Culture HOLDING FOR POSSIBLE PATHOGEN  Final   Report Status PENDING  Incomplete    Radiology Reports Dg Chest 1 View  06/05/2015   CLINICAL DATA:  Endotracheal tube placement.  Initial encounter.  EXAM: CHEST  1 VIEW  COMPARISON:  Chest radiograph performed earlier today at 8:45 p.m.  FINDINGS: The patient's endotracheal tube is seen ending 4 cm above the carina. An enteric tube is noted extending below the diaphragm.  The lungs appear clear bilaterally. No focal consolidation, pleural effusion or pneumothorax is seen. Mild scarring is suggested at the right lung apex, though this may reflect overlying tubing.  The cardiomediastinal silhouette is borderline enlarged. No acute osseous abnormalities are identified. A large paraesophageal hernia is again noted.  IMPRESSION: 1. Endotracheal tube seen ending 4 cm above the carina. 2. Lungs clear bilaterally. 3. Borderline cardiomegaly. 4. Large paraesophageal hernia again noted.   Electronically Signed   By: Roanna Raider M.D.   On: 06/05/2015 23:57   Dg Chest 1 View  06/05/2015   CLINICAL DATA:  Altered mental status. Weakness. Shortness of breath.  EXAM: CHEST  1 VIEW  COMPARISON:  One-view chest x-ray 02/22/2014.  FINDINGS: Cardiomegaly is again noted. Lung volumes are low. This is a reversed lordotic view. No focal airspace disease is evident. The visualized soft tissues and bony thorax are unremarkable.  IMPRESSION: 1. Stable cardiomegaly without failure.   Electronically Signed   By: Marin Roberts M.D.   On: 06/05/2015 21:22   Dg Lumbar Spine Complete  05/13/2015   CLINICAL DATA:  Acute onset of lower back pain.  Initial encounter.  EXAM: LUMBAR SPINE - COMPLETE 4+ VIEW  COMPARISON:  Lumbar spine radiographs and CT performed 03/03/2010, and lumbar spine MRI performed 03/04/2010  FINDINGS: There is no evidence of acute fracture or subluxation. There appears to be  significant chronic compression deformity of vertebral body L1, worsened from prior studies. Vertebral bodies demonstrate normal alignment. Mild disc space narrowing is noted at L5-S1.  The visualized bowel gas pattern is unremarkable in appearance; air and stool are noted within the colon. The sacroiliac joints are within normal limits. Scattered vascular calcifications are seen.  IMPRESSION: 1. No evidence of acute fracture or subluxation along the lumbar spine. 2. Significant chronic compression deformity of vertebral body L1, worsened from prior studies.   Electronically Signed   By: Roanna Raider M.D.   On: 05/13/2015 02:23   Ct Head Wo Contrast  06/06/2015   CLINICAL DATA:  Acute onset of altered mental status. High blood pressure. Initial encounter.  EXAM: CT HEAD WITHOUT CONTRAST  TECHNIQUE: Contiguous axial images were obtained from the base of the skull through the vertex without intravenous contrast.  COMPARISON:  None.  FINDINGS: There is no evidence of acute infarction, mass lesion, or intra- or extra-axial hemorrhage on CT.  Prominence of the ventricles and sulci likely reflects mild cortical volume loss. Scattered periventricular and subcortical white matter change reflects small vessel ischemic microangiopathy. Mild cerebellar atrophy is noted. Chronic ischemic change is noted at the external capsule bilaterally.  The brainstem and fourth ventricle are within normal limits. The basal ganglia are unremarkable in appearance. The cerebral hemispheres demonstrate grossly normal gray-white differentiation. No mass effect or midline shift is seen.  There is no evidence of fracture; visualized osseous structures are unremarkable in appearance. The visualized portions of the orbits are within normal limits. The paranasal sinuses and mastoid air cells are well-aerated. No significant soft tissue abnormalities are seen.  IMPRESSION: 1. No acute intracranial pathology seen on CT. 2. Mild cortical volume  loss and scattered small vessel ischemic microangiopathy.   Electronically Signed   By: Roanna Raider M.D.   On: 06/06/2015 02:15     CBC  Recent  Labs Lab 06/05/15 2026 06/06/15 0240 06/07/15 0315  WBC 14.0* 8.8 6.9  HGB 14.5 11.1* 9.3*  HCT 46.0 35.9 29.3*  PLT 181 114* 85*  MCV 94.5 96.2 93.9  MCH 29.7 29.9 29.9  MCHC 31.4* 31.1* 31.8*  RDW 18.2* 17.0* 17.7*  LYMPHSABS 1.2  --   --   MONOABS 1.0*  --   --   EOSABS 0.0  --   --   BASOSABS 0.0  --   --     Chemistries   Recent Labs Lab 06/05/15 2140 06/06/15 0240 06/07/15 0315  NA 148* 148* 146*  K 5.0 4.5 4.3  CL 107 113* 116*  CO2 34* 30 26  GLUCOSE 105* 103* 116*  BUN 57* 52* 48*  CREATININE 1.59* 1.45* 1.76*  CALCIUM 13.3* 11.4* 10.6*  AST 16 18  --   ALT 18 14  --   ALKPHOS 106 76  --   BILITOT 1.3* 0.9  --    ------------------------------------------------------------------------------------------------------------------ estimated creatinine clearance is 20 mL/min (by C-G formula based on Cr of 1.76). ------------------------------------------------------------------------------------------------------------------ No results for input(s): HGBA1C in the last 72 hours. ------------------------------------------------------------------------------------------------------------------ No results for input(s): CHOL, HDL, LDLCALC, TRIG, CHOLHDL, LDLDIRECT in the last 72 hours. ------------------------------------------------------------------------------------------------------------------ No results for input(s): TSH, T4TOTAL, T3FREE, THYROIDAB in the last 72 hours.  Invalid input(s): FREET3 ------------------------------------------------------------------------------------------------------------------ No results for input(s): VITAMINB12, FOLATE, FERRITIN, TIBC, IRON, RETICCTPCT in the last 72 hours.  Coagulation profile No results for input(s): INR, PROTIME in the last 168 hours.  No results for  input(s): DDIMER in the last 72 hours.  Cardiac Enzymes  Recent Labs Lab 06/05/15 2140  TROPONINI 0.04*   ------------------------------------------------------------------------------------------------------------------ Invalid input(s): POCBNP    Assessment & Plan   1. Acute respiratory failure with hypercapnia: This is likely due to combination of acute on chronic COPD exasperation, as well as recent narcotic treatment for her back pain. Continue mechanical ventilation continue nebs and steroids 2.Sepsis, meeting septic criteria by heart rate, leukocytosis, respiratory rate present on arrival. Source cellulitis bilateral lower extremity continue vancomycin and Levaquin. Cultures from wound is growing MRSA 3. Essential hypertension: We will hold Aldactone, Lasix, propranolol given relative hypotension 4. Acute renal failure likely ATN: increase IV fluids monitor renal function if worsens nephrology evaluation in the morning\ 5. Miscellaneous heparin for DVT prophylaxis  CODE STATUS addressed with daughter Roanna Epley , she states that her mother has living will and would not want to be resuscitated. At this point we will make her DO NOT RESUSCITATE      Code Status Orders        Start     Ordered   06/06/15 1049  Do not attempt resuscitation (DNR)   Continuous    Question Answer Comment  In the event of cardiac or respiratory ARREST Do not call a "code blue"   In the event of cardiac or respiratory ARREST Do not perform Intubation, CPR, defibrillation or ACLS   In the event of cardiac or respiratory ARREST Use medication by any route, position, wound care, and other measures to relive pain and suffering. May use oxygen, suction and manual treatment of airway obstruction as needed for comfort.      06/06/15 1048    Advance Directive Documentation        Most Recent Value   Type of Advance Directive  Living will, Healthcare Power of Gerrit Friends [Pt's daughter [name]  [place]]   Pre-existing out of facility DNR order (yellow form or pink MOST form)     "  MOST" Form in Place?        Consults  pulmonary critical care DVT Prophylaxis  heparin  Lab Results  Component Value Date   PLT 85* 06/07/2015     Time Spent in minutes   35 minutes of critical care time spent Case discussed with daughter and sister   Auburn Bilberry M.D on 06/07/2015 at 12:52 PM  Between 7am to 6pm - Pager - [phone]  After 6pm go to [email or web address] - password [email or web address] [facility]  [facility]   Office  [phone]

## 2015-06-07 NOTE — Progress Notes (Signed)
During mouth care pt. Awoke and became agitated, HR increased to 150's, bolus of Fentanyl given, Versed push, pt resting comfortably, HR returned to normal.

## 2015-06-07 NOTE — Progress Notes (Signed)
Nutrition Follow-up    INTERVENTION:   EN: tolerating TF, BP improved. Recommend increasing TF to rate of 30 ml/hr with goal rate of 50 ml/hr providing 106 g of protein, 1440 kcals, 972 mL of free water. Sodium improving, recommend continuing current water flushes   NUTRITION DIAGNOSIS:   Inadequate oral intake related to acute illness as evidenced by NPO status. Being addressed via TF  GOAL:   Provide needs based on ASPEN/SCCM guidelines  MONITOR:    (Energy Intake, EN, Digestive System, Electrolyte/Renal Profile, Glucose Profile, Pulmonary)   ASSESSMENT:    Pt remains on vent, failed weaning this AM due to agitation   Diet Order:  Diet NPO time specified Except for: Sips with Meds   EN: tolerating Vital 1.2 at rate of 15 ml/hr, no signs of TF intolerance  Electrolyte and Renal Profile:  Recent Labs Lab 06/05/15 2140 06/06/15 0240 06/07/15 0315  BUN 57* 52* 48*  CREATININE 1.59* 1.45* 1.76*  NA 148* 148* 146*  K 5.0 4.5 4.3   Glucose Profile:  Recent Labs  06/06/15 1853 06/06/15 2148 06/07/15 0034  GLUCAP 89 116* 103*   Meds: NS at 100 ml/hr, precedex  Skin:  Reviewed, no issues  Last BM:  8/12  Height:   Ht Readings from Last 1 Encounters:  06/05/15  (1.6 m)    Weight:   Wt Readings from Last 1 Encounters:  06/07/15 137 lb 9.1 oz (62.4 kg)    Ideal Body Weight:     BMI:  Body mass index is 24.38 kg/(m^2).  Estimated Nutritional Needs:   Kcal:  1438 kcals (ve: 6.6, Tmax: 38)  Protein:  86-114 g(1.5-2.0 g/kg)   Fluid:  1425-1710 mL (25-30 ml/kg)   HIGH Care Level  Romelle Starcher MS, RD, LDN [phone] Pager

## 2015-06-07 NOTE — Progress Notes (Signed)
ANTIBIOTIC CONSULT NOTE - FOLLOW UP   Pharmacy Consult for vancomycin dosing Indication: sepsis  Allergies  Allergen Reactions  . Penicillins Other (See Comments)    Reaction:  Unknown     Patient Measurements: Height:  (160 cm) Weight: 137 lb 9.1 oz (62.4 kg) IBW/kg (Calculated) : 52.4 Adjusted Body Weight: 56.8kg  Vital Signs: Temp: 99 F (37.2 C) (08/14 0700) BP: 98/51 mmHg (08/14 0700) Pulse Rate: 55 (08/14 0700) Intake/Output from previous day: 08/13 0701 - 08/14 0700 In: 793.5 [I.V.:453.7; NG/GT:189.8; IV Piggyback:150] Out: 600 [Urine:600] Intake/Output from this shift:    Labs:  Recent Labs  06/05/15 2026 06/05/15 2140 06/06/15 0240 06/07/15 0315  WBC 14.0*  --  8.8 6.9  HGB 14.5  --  11.1* 9.3*  PLT 181  --  114* 85*  CREATININE  --  1.59* 1.45* 1.76*   Estimated Creatinine Clearance: 20 mL/min (by C-G formula based on Cr of 1.76). No results for input(s): VANCOTROUGH, VANCOPEAK, VANCORANDOM, GENTTROUGH, GENTPEAK, GENTRANDOM, TOBRATROUGH, TOBRAPEAK, TOBRARND, AMIKACINPEAK, AMIKACINTROU, AMIKACIN in the last 72 hours.   Microbiology: Recent Results (from the past 720 hour(s))  MRSA PCR Screening     Status: Abnormal   Collection Time: 06/05/15  8:16 PM  Result Value Ref Range Status   MRSA by PCR POSITIVE (A) NEGATIVE Final    Comment:        The GeneXpert MRSA Assay (FDA approved for NASAL specimens only), is one component of a comprehensive MRSA colonization surveillance program. It is not intended to diagnose MRSA infection nor to guide or monitor treatment for MRSA infections. CRITICAL RESULT CALLED TO, READ BACK BY AND VERIFIED WITH: CHRISTY AUSTIN AT 1610 06/06/15 SDR   Wound culture     Status: None (Preliminary result)   Collection Time: 06/05/15  8:54 PM  Result Value Ref Range Status   Specimen Description ABSCESS  Final   Special Requests Normal  Final   Gram Stain PENDING  Incomplete   Culture   Final    HEAVY GROWTH  STAPHYLOCOCCUS AUREUS SUSCEPTIBILITIES TO FOLLOW    Report Status PENDING  Incomplete    Medical History: Past Medical History  Diagnosis Date  . COPD (chronic obstructive pulmonary disease)   . CHF (congestive heart failure)   . Hypertension     Medications:   Assessment: Blood and wound cx pending UA: (-)  Scr increased to 1.76, original dosing based on scr of 1.59 mg/dl.   Updated PK Parameters: Kel (hr-1): 0.021 Half-life (hrs): 33.01 Vd (liters): 43.68 (factor used: 0.7 L/kg)  Goal of Therapy:  Vancomycin trough level 15-20 mcg/ml  Plan:  Will continue Vancomycin 750 mg IV q36 hours.    Demetrius Charity, PharmD   06/07/2015

## 2015-06-08 ENCOUNTER — Inpatient Hospital Stay: Payer: Medicare Other

## 2015-06-08 DIAGNOSIS — J9602 Acute respiratory failure with hypercapnia: Secondary | ICD-10-CM

## 2015-06-08 LAB — BLOOD GAS, ARTERIAL
ACID-BASE DEFICIT: 0.8 mmol/L (ref 0.0–2.0)
ALLENS TEST (PASS/FAIL): POSITIVE — AB
Acid-Base Excess: 0.3 mmol/L (ref 0.0–3.0)
BICARBONATE: 24.6 meq/L (ref 21.0–28.0)
BICARBONATE: 24.9 meq/L (ref 21.0–28.0)
FIO2: 0.3
FIO2: 30
MECHANICAL RATE: 14
MECHVT: 450 mL
O2 SAT: 97.8 %
O2 Saturation: 99.3 %
PATIENT TEMPERATURE: 37
PATIENT TEMPERATURE: 37
PCO2 ART: 44 mmHg (ref 32.0–48.0)
PEEP/CPAP: 5 cmH2O
PEEP: 5 cmH2O
PH ART: 7.36 (ref 7.350–7.450)
PO2 ART: 145 mmHg — AB (ref 83.0–108.0)
PO2 ART: 105 mmHg (ref 83.0–108.0)
PRESSURE SUPPORT: 8 cmH2O
pCO2 arterial: 38 mmHg (ref 32.0–48.0)
pH, Arterial: 7.42 (ref 7.350–7.450)

## 2015-06-08 LAB — BASIC METABOLIC PANEL
ANION GAP: 4 — AB (ref 5–15)
BUN: 44 mg/dL — ABNORMAL HIGH (ref 6–20)
CHLORIDE: 118 mmol/L — AB (ref 101–111)
CO2: 24 mmol/L (ref 22–32)
CREATININE: 1.57 mg/dL — AB (ref 0.44–1.00)
Calcium: 10.9 mg/dL — ABNORMAL HIGH (ref 8.9–10.3)
GFR calc non Af Amer: 29 mL/min — ABNORMAL LOW (ref >60)
GFR, EST AFRICAN AMERICAN: 34 mL/min — AB (ref >60)
Glucose, Bld: 222 mg/dL — ABNORMAL HIGH (ref 65–99)
Potassium: 4.1 mmol/L (ref 3.5–5.1)
Sodium: 146 mmol/L — ABNORMAL HIGH (ref 135–145)

## 2015-06-08 LAB — CBC
HCT: 34.5 % — ABNORMAL LOW (ref 35.0–47.0)
HEMOGLOBIN: 11 g/dL — AB (ref 12.0–16.0)
MCH: 29.9 pg (ref 26.0–34.0)
MCHC: 31.8 g/dL — ABNORMAL LOW (ref 32.0–36.0)
MCV: 94 fL (ref 80.0–100.0)
Platelets: 78 10*3/uL — ABNORMAL LOW (ref 150–440)
RBC: 3.66 MIL/uL — AB (ref 3.80–5.20)
RDW: 17.5 % — ABNORMAL HIGH (ref 11.5–14.5)
WBC: 8.9 10*3/uL (ref 3.6–11.0)

## 2015-06-08 LAB — GLUCOSE, CAPILLARY
GLUCOSE-CAPILLARY: 208 mg/dL — AB (ref 65–99)
GLUCOSE-CAPILLARY: 182 mg/dL — AB (ref 65–99)
Glucose-Capillary: 151 mg/dL — ABNORMAL HIGH (ref 65–99)
Glucose-Capillary: 164 mg/dL — ABNORMAL HIGH (ref 65–99)
Glucose-Capillary: 182 mg/dL — ABNORMAL HIGH (ref 65–99)
Glucose-Capillary: 185 mg/dL — ABNORMAL HIGH (ref 65–99)

## 2015-06-08 MED ORDER — VITAL AF 1.2 CAL PO LIQD
1000.0000 mL | ORAL | Status: DC
  Administered 2015-06-09: 1000 mL

## 2015-06-08 MED ORDER — FAMOTIDINE 20 MG PO TABS
20.0000 mg | ORAL_TABLET | Freq: Every day | ORAL | Status: DC
  Administered 2015-06-08 – 2015-06-09 (×2): 20 mg via ORAL
  Filled 2015-06-08 (×2): qty 1

## 2015-06-08 MED ORDER — BUDESONIDE 0.5 MG/2ML IN SUSP
0.5000 mg | Freq: Two times a day (BID) | RESPIRATORY_TRACT | Status: DC
  Administered 2015-06-08 – 2015-06-10 (×4): 0.5 mg via RESPIRATORY_TRACT
  Filled 2015-06-08 (×4): qty 2

## 2015-06-08 MED ORDER — SENNOSIDES-DOCUSATE SODIUM 8.6-50 MG PO TABS
2.0000 | ORAL_TABLET | Freq: Two times a day (BID) | ORAL | Status: DC
  Administered 2015-06-08 – 2015-06-09 (×4): 2 via ORAL
  Filled 2015-06-08 (×4): qty 2

## 2015-06-08 MED ORDER — CHLORHEXIDINE GLUCONATE CLOTH 2 % EX PADS
6.0000 | MEDICATED_PAD | Freq: Every day | CUTANEOUS | Status: DC
Start: 2015-06-08 — End: 2015-06-10
  Administered 2015-06-09 – 2015-06-10 (×2): 6 via TOPICAL

## 2015-06-08 MED ORDER — MUPIROCIN 2 % EX OINT
1.0000 "application " | TOPICAL_OINTMENT | Freq: Two times a day (BID) | CUTANEOUS | Status: DC
  Administered 2015-06-08 – 2015-06-10 (×5): 1 via NASAL
  Filled 2015-06-08 (×2): qty 22

## 2015-06-08 NOTE — Progress Notes (Signed)
Patient is active with SN/PT services from [facility].

## 2015-06-08 NOTE — Progress Notes (Signed)
[facility] [place] at [facility]                                                                                                                                                                                            Patient Demographics                                            Patient Demographics   Priscilla Powers, is a 81 y.o. female, DOB - [birth year], XBJ:[id number]  Admit date - 06/05/2015   Admitting Physician Wyatt Haste, MD  Outpatient Primary MD for the patient is No primary care provider on file.   LOS - 3  Subjective:  Remains on the ventilator   Review of Systems:   CONSTITUTIONAL: No documented fever. Unable to provide any further review of systems due to patient being on the ventilator  Vitals:   Filed Vitals:   06/08/15 1300 06/08/15 1400 06/08/15 1404 06/08/15 1430  BP: 139/66 140/76    Pulse: 68 79 65   Temp:    99 F (37.2 C)  TempSrc:      Resp: 15 16 14 19   Height:      Weight:      SpO2: 100% 100% 100%     Wt Readings from Last 3 Encounters:  06/08/15 65 kg (143 lb 4.8 oz)  05/15/15 61.689 kg (136 lb)  05/12/15 62.143 kg (137 lb)     Intake/Output Summary (Last 24 hours) at 06/08/15 1437 Last data filed at 06/08/15 0900  Gross per 24 hour  Intake 3317.07 ml  Output   1075 ml  Net 2242.07 ml    Physical Exam:   GENERAL: Critically ill-appearing female on the ventilator  HEAD, EYES, EARS, NOSE AND THROAT: Atraumatic, normocephalic. . Pupils equal and reactive to light. Sclerae anicteric. No conjunctival injection. No oro-pharyngeal erythema.  NECK: Supple. There is no jugular venous distention. No bruits, no lymphadenopathy, no thyromegaly.  HEART: Regular rate and rhythm,. No murmurs, no rubs, no clicks.  LUNGS: Clear to auscultation bilaterally. No rales or rhonchi. No wheezes.  ABDOMEN: Soft, flat, nontender, nondistended. Has good bowel sounds. No hepatosplenomegaly appreciated.  EXTREMITIES: No evidence  of any cyanosis, clubbing, or peripheral edema.  +2 pedal and radial pulses bilaterally.  NEUROLOGIC: Sedated on the ventilator SKIN: Bilateral lower extremity erythema and warmth Psych: Sedated and on the ventilator LN: No inguinal LN enlargement    Antibiotics   Anti-infectives    Start  Dose/Rate Route Frequency Ordered Stop   06/07/15 1415  Levofloxacin (LEVAQUIN) IVPB 250 mg     250 mg 50 mL/hr over 60 Minutes Intravenous Every 24 hours 06/06/15 1412     06/06/15 2300  vancomycin (VANCOCIN) IVPB 750 mg/150 ml premix     750 mg 150 mL/hr over 60 Minutes Intravenous Every 36 hours 06/06/15 0037     06/06/15 1415  levofloxacin (LEVAQUIN) IVPB 500 mg     500 mg 100 mL/hr over 60 Minutes Intravenous  Once 06/06/15 1412 06/06/15 1543   06/06/15 1345  levofloxacin (LEVAQUIN) IVPB 500 mg  Status:  Discontinued     500 mg 100 mL/hr over 60 Minutes Intravenous Every 24 hours 06/06/15 1344 06/06/15 1410   06/06/15 0000  vancomycin (VANCOCIN) IVPB 1000 mg/200 mL premix  Status:  Discontinued     1,000 mg 200 mL/hr over 60 Minutes Intravenous  Once 06/05/15 2357 06/05/15 2357   06/05/15 2330  vancomycin (VANCOCIN) IVPB 1000 mg/200 mL premix     1,000 mg 200 mL/hr over 60 Minutes Intravenous  Once 06/05/15 2316 06/06/15 0133      Medications   Scheduled Meds: . antiseptic oral rinse  7 mL Mouth Rinse q12n4p  . budesonide (PULMICORT) nebulizer solution  0.5 mg Nebulization BID  . chlorhexidine  15 mL Mouth Rinse BID  . Chlorhexidine Gluconate Cloth  6 each Topical Q0600  . famotidine  20 mg Oral Daily  . [START ON 06/09/2015] feeding supplement (VITAL AF 1.2 CAL)  1,000 mL Per Tube Q24H  . free water  25 mL Per Tube 6 times per day  . heparin  5,000 Units Subcutaneous 3 times per day  . ipratropium-albuterol  3 mL Nebulization Q6H  . levofloxacin (LEVAQUIN) IV  250 mg Intravenous Q24H  . methylPREDNISolone (SOLU-MEDROL) injection  60 mg Intravenous Q6H  . mupirocin ointment  1  application Nasal BID  . senna-docusate  2 tablet Oral BID  . sodium chloride  1,000 mL Intravenous Once  . sodium chloride  3 mL Intravenous Q12H  . vancomycin  750 mg Intravenous Q36H   Continuous Infusions: . dexmedetomidine 1 mcg/kg/hr (06/08/15 1420)   PRN Meds:.acetaminophen **OR** acetaminophen, fentaNYL (SUBLIMAZE) injection, fentaNYL (SUBLIMAZE) injection, ondansetron **OR** ondansetron (ZOFRAN) IV   Data Review:   Micro Results Recent Results (from the past 240 hour(s))  MRSA PCR Screening     Status: Abnormal   Collection Time: 06/05/15  8:16 PM  Result Value Ref Range Status   MRSA by PCR POSITIVE (A) NEGATIVE Final    Comment:        The GeneXpert MRSA Assay (FDA approved for NASAL specimens only), is one component of a comprehensive MRSA colonization surveillance program. It is not intended to diagnose MRSA infection nor to guide or monitor treatment for MRSA infections. CRITICAL RESULT CALLED TO, READ BACK BY AND VERIFIED WITH: CHRISTY AUSTIN AT 1610 06/06/15 SDR   Wound culture     Status: None (Preliminary result)   Collection Time: 06/05/15  8:54 PM  Result Value Ref Range Status   Specimen Description ABSCESS  Final   Special Requests Normal  Final   Gram Stain RARE WBC SEEN FEW GRAM POSITIVE COCCI IN PAIRS   Final   Culture   Final    HEAVY GROWTH METHICILLIN RESISTANT STAPHYLOCOCCUS AUREUS CRITICAL RESULT CALLED TO, READ BACK BY AND VERIFIED WITH: ADAM [facility] AT 1126 06/07/15 DV    Report Status PENDING  Incomplete   Organism ID, Bacteria  METHICILLIN RESISTANT STAPHYLOCOCCUS AUREUS  Final      Susceptibility   Methicillin resistant staphylococcus aureus - MIC*    CIPROFLOXACIN >=8 RESISTANT Resistant     GENTAMICIN <=0.5 SENSITIVE Sensitive     OXACILLIN >=4 RESISTANT Resistant     VANCOMYCIN <=0.5 SENSITIVE Sensitive     TRIMETH/SULFA <=10 SENSITIVE Sensitive     CEFOXITIN SCREEN Value in next row Resistant      POSITIVECEFOXITIN SCREEN  - This test may be used to predict mecA-mediated oxacillin resistance, and it is based on the cefoxitin disk screen test.  The cefoxitin screen and oxacillin work in combination to determine the final interpretation reported for oxacillin.     Inducible Clindamycin Value in next row Resistant      POSITIVEINDUCIBLE CLINDAMYCIN RESISTANCE - A positive ICR test is indicative of inducible resistance to macrolides, lincosamides, and type B streptogramin.  This isolate is presumed to be resistant to Clindamycin, however, Clindamycin may still be effective in some patients.     TETRACYCLINE Value in next row Sensitive      SENSITIVE<=1    * HEAVY GROWTH METHICILLIN RESISTANT STAPHYLOCOCCUS AUREUS  Blood culture (routine x 2)     Status: None (Preliminary result)   Collection Time: 06/05/15  9:27 PM  Result Value Ref Range Status   Specimen Description BLOOD  Final   Special Requests BOTTLES DRAWN AEROBIC AND ANAEROBIC 4CC  Final   Culture NO GROWTH 3 DAYS  Final   Report Status PENDING  Incomplete  Blood culture (routine x 2)     Status: None (Preliminary result)   Collection Time: 06/05/15 10:49 PM  Result Value Ref Range Status   Specimen Description BLOOD  Final   Special Requests BOTTLES DRAWN AEROBIC AND ANAEROBIC 2CC  Final   Culture  Setup Time   Final    GRAM POSITIVE COCCI AEROBIC BOTTLE ONLY CRITICAL RESULT CALLED TO, READ BACK BY AND VERIFIED WITH: ADAM SCARBOROUGH AT 1244 06/07/15 DV    Culture   Final    COAGULASE NEGATIVE STAPHYLOCOCCUS AEROBIC BOTTLE ONLY Results consistent with contamination.    Report Status PENDING  Incomplete  Culture, respiratory (NON-Expectorated)     Status: None (Preliminary result)   Collection Time: 06/06/15 12:40 PM  Result Value Ref Range Status   Specimen Description TRACHEAL ASPIRATE  Final   Special Requests NONE  Final   Gram Stain PENDING  Incomplete   Culture HOLDING FOR POSSIBLE PATHOGEN  Final   Report Status PENDING  Incomplete     Radiology Reports Dg Chest 1 View  06/05/2015   CLINICAL DATA:  Endotracheal tube placement.  Initial encounter.  EXAM: CHEST  1 VIEW  COMPARISON:  Chest radiograph performed earlier today at 8:45 p.m.  FINDINGS: The patient's endotracheal tube is seen ending 4 cm above the carina. An enteric tube is noted extending below the diaphragm.  The lungs appear clear bilaterally. No focal consolidation, pleural effusion or pneumothorax is seen. Mild scarring is suggested at the right lung apex, though this may reflect overlying tubing.  The cardiomediastinal silhouette is borderline enlarged. No acute osseous abnormalities are identified. A large paraesophageal hernia is again noted.  IMPRESSION: 1. Endotracheal tube seen ending 4 cm above the carina. 2. Lungs clear bilaterally. 3. Borderline cardiomegaly. 4. Large paraesophageal hernia again noted.   Electronically Signed   By: Roanna Raider M.D.   On: 06/05/2015 23:57   Dg Chest 1 View  06/05/2015   CLINICAL DATA:  Altered mental  status. Weakness. Shortness of breath.  EXAM: CHEST  1 VIEW  COMPARISON:  One-view chest x-ray 02/22/2014.  FINDINGS: Cardiomegaly is again noted. Lung volumes are low. This is a reversed lordotic view. No focal airspace disease is evident. The visualized soft tissues and bony thorax are unremarkable.  IMPRESSION: 1. Stable cardiomegaly without failure.   Electronically Signed   By: Marin Roberts M.D.   On: 06/05/2015 21:22   Dg Lumbar Spine Complete  05/13/2015   CLINICAL DATA:  Acute onset of lower back pain.  Initial encounter.  EXAM: LUMBAR SPINE - COMPLETE 4+ VIEW  COMPARISON:  Lumbar spine radiographs and CT performed 03/03/2010, and lumbar spine MRI performed 03/04/2010  FINDINGS: There is no evidence of acute fracture or subluxation. There appears to be significant chronic compression deformity of vertebral body L1, worsened from prior studies. Vertebral bodies demonstrate normal alignment. Mild disc space narrowing  is noted at L5-S1.  The visualized bowel gas pattern is unremarkable in appearance; air and stool are noted within the colon. The sacroiliac joints are within normal limits. Scattered vascular calcifications are seen.  IMPRESSION: 1. No evidence of acute fracture or subluxation along the lumbar spine. 2. Significant chronic compression deformity of vertebral body L1, worsened from prior studies.   Electronically Signed   By: Roanna Raider M.D.   On: 05/13/2015 02:23   Ct Head Wo Contrast  06/06/2015   CLINICAL DATA:  Acute onset of altered mental status. High blood pressure. Initial encounter.  EXAM: CT HEAD WITHOUT CONTRAST  TECHNIQUE: Contiguous axial images were obtained from the base of the skull through the vertex without intravenous contrast.  COMPARISON:  None.  FINDINGS: There is no evidence of acute infarction, mass lesion, or intra- or extra-axial hemorrhage on CT.  Prominence of the ventricles and sulci likely reflects mild cortical volume loss. Scattered periventricular and subcortical white matter change reflects small vessel ischemic microangiopathy. Mild cerebellar atrophy is noted. Chronic ischemic change is noted at the external capsule bilaterally.  The brainstem and fourth ventricle are within normal limits. The basal ganglia are unremarkable in appearance. The cerebral hemispheres demonstrate grossly normal gray-white differentiation. No mass effect or midline shift is seen.  There is no evidence of fracture; visualized osseous structures are unremarkable in appearance. The visualized portions of the orbits are within normal limits. The paranasal sinuses and mastoid air cells are well-aerated. No significant soft tissue abnormalities are seen.  IMPRESSION: 1. No acute intracranial pathology seen on CT. 2. Mild cortical volume loss and scattered small vessel ischemic microangiopathy.   Electronically Signed   By: Roanna Raider M.D.   On: 06/06/2015 02:15   Dg Chest Port 1 View  06/08/2015    CLINICAL DATA:  Acute on chronic respiratory failure.  Sepsis.  EXAM: PORTABLE CHEST - 1 VIEW  COMPARISON:  06/05/2015.  FINDINGS: 0608 hr. The endotracheal tube tip is 2 cm above the carina. Nasogastric tube is looped within a large hiatal hernia and its tip does not extend below the diaphragm. The heart size and mediastinal contours are stable. There are developing bibasilar airspace opacities and probable bilateral pleural effusions. No edema or pneumothorax identified. The bones appear unchanged.  IMPRESSION: Developing bibasilar airspace opacities and probable bilateral pleural effusions. Large hiatal hernia in which the nasogastric tube is looped.   Electronically Signed   By: Carey Bullocks M.D.   On: 06/08/2015 07:39     CBC  Recent Labs Lab 06/05/15 2026 06/06/15 0240 06/07/15 0315 06/08/15 0304  WBC 14.0* 8.8 6.9 8.9  HGB 14.5 11.1* 9.3* 11.0*  HCT 46.0 35.9 29.3* 34.5*  PLT 181 114* 85* 78*  MCV 94.5 96.2 93.9 94.0  MCH 29.7 29.9 29.9 29.9  MCHC 31.4* 31.1* 31.8* 31.8*  RDW 18.2* 17.0* 17.7* 17.5*  LYMPHSABS 1.2  --   --   --   MONOABS 1.0*  --   --   --   EOSABS 0.0  --   --   --   BASOSABS 0.0  --   --   --     Chemistries   Recent Labs Lab 06/05/15 2140 06/06/15 0240 06/07/15 0315 06/08/15 0304  NA 148* 148* 146* 146*  K 5.0 4.5 4.3 4.1  CL 107 113* 116* 118*  CO2 34* 30 26 24   GLUCOSE 105* 103* 116* 222*  BUN 57* 52* 48* 44*  CREATININE 1.59* 1.45* 1.76* 1.57*  CALCIUM 13.3* 11.4* 10.6* 10.9*  AST 16 18  --   --   ALT 18 14  --   --   ALKPHOS 106 76  --   --   BILITOT 1.3* 0.9  --   --    ------------------------------------------------------------------------------------------------------------------ estimated creatinine clearance is 24.6 mL/min (by C-G formula based on Cr of 1.57). ------------------------------------------------------------------------------------------------------------------ No results for input(s): HGBA1C in the last 72  hours. ------------------------------------------------------------------------------------------------------------------ No results for input(s): CHOL, HDL, LDLCALC, TRIG, CHOLHDL, LDLDIRECT in the last 72 hours. ------------------------------------------------------------------------------------------------------------------ No results for input(s): TSH, T4TOTAL, T3FREE, THYROIDAB in the last 72 hours.  Invalid input(s): FREET3 ------------------------------------------------------------------------------------------------------------------ No results for input(s): VITAMINB12, FOLATE, FERRITIN, TIBC, IRON, RETICCTPCT in the last 72 hours.  Coagulation profile No results for input(s): INR, PROTIME in the last 168 hours.  No results for input(s): DDIMER in the last 72 hours.  Cardiac Enzymes  Recent Labs Lab 06/05/15 2140  TROPONINI 0.04*   ------------------------------------------------------------------------------------------------------------------ Invalid input(s): POCBNP    Assessment & Plan   1. Acute respiratory failure with hypercapnia: This is likely due to combination of acute on chronic COPD exasperation, as well as recent narcotic treatment for her back pain. Continue mechanical ventilation continue nebs and steroids patient becomes agitated with holding sedation, weaning parameters as tolerated  2.Sepsis, meeting septic criteria by heart rate, leukocytosis, respiratory rate present on arrival. Source cellulitis bilateral lower extremity continue vancomycin and Levaquin. Cultures from wound is growing MRSA 3. Essential hypertension: We will hold Aldactone, Lasix, propranolol given relative hypotension 4. Acute renal failure likely [id number] with IV fluids  5. Miscellaneous heparin for DVT prophylaxis  CODE STATUSDO NOT RESUSCITATE     Code Status Orders        Start     Ordered   06/06/15 1049  Do not attempt resuscitation (DNR)   Continuous    Question  Answer Comment  In the event of cardiac or respiratory ARREST Do not call a "code blue"   In the event of cardiac or respiratory ARREST Do not perform Intubation, CPR, defibrillation or ACLS   In the event of cardiac or respiratory ARREST Use medication by any route, position, wound care, and other measures to relive pain and suffering. May use oxygen, suction and manual treatment of airway obstruction as needed for comfort.      06/06/15 1048    Advance Directive Documentation        Most Recent Value   Type of Advance Directive  Living will, Healthcare Power of Gerrit Friends [Pt's daughter [name] Honeycutt]   Pre-existing out of facility DNR order (yellow form  or pink MOST form)     "MOST" Form in Place?        Consults  pulmonary critical care DVT Prophylaxis  heparin  Lab Results  Component Value Date   PLT 78* 06/08/2015     Time Spent in minutes   35 minutes of critical care time spent    Auburn Bilberry M.D on 06/08/2015 at 2:37 PM  Between 7am to 6pm - Pager - [phone]  After 6pm go to [email or web address] - password [email or web address] [facility]  [facility]   Office  [phone]

## 2015-06-08 NOTE — Progress Notes (Signed)
PULMONARY / CRITICAL CARE MEDICINE   Name: Priscilla Powers MRN: [id number] DOB: [birth year]    ADMISSION DATE: 06/05/2015 CONSULTATION DATE: 06/06/15  REFERRING MD : Dr. Auburn Bilberry  CHIEF COMPLAINT:  Altered Mental status, weakness   Pulmonologist - Dr. Meredeth Ide  SUBJECTIVE:  No acute events overnight.  Failed SAT yesterday due to delerium, will try SAt/SBt again today.  Patient with acute COPD exacerbation  VITAL SIGNS: Temp:  [97.9 F (36.6 C)-99.5 F (37.5 C)] 99 F (37.2 C) (08/15 1500) Pulse Rate:  [37-82] 65 (08/15 1404) Resp:  [6-35] 15 (08/15 1500) BP: (127-150)/(51-83) 150/68 mmHg (08/15 1500) SpO2:  [99 %-100 %] 100 % (08/15 1404) FiO2 (%):  [30 %] 30 % (08/15 1404) Weight:  [143 lb 4.8 oz (65 kg)] 143 lb 4.8 oz (65 kg) (08/15 0438) HEMODYNAMICS:   VENTILATOR SETTINGS: Vent Mode:  [-] Spontaneous FiO2 (%):  [30 %] 30 % Set Rate:  [14 bmp] 14 bmp Vt Set:  [450 mL] 450 mL PEEP:  [5 cmH20] 5 cmH20 Pressure Support:  [5 cmH20-8 cmH20] 8 cmH20 INTAKE / OUTPUT:  Intake/Output Summary (Last 24 hours) at 06/08/15 1538 Last data filed at 06/08/15 0900  Gross per 24 hour  Intake 3377.07 ml  Output   1075 ml  Net 2302.07 ml    PHYSICAL EXAMINATION: General:  Moderately agitated on the vent, but somnolent at times Neuro:  RASS: 1-3 HEENT:  [place]/AT, no lesions Cardiovascular:  S1, S2, no murmurs Lungs:  Coarse upper airway sounds, fine exp wheezes at the bases Abdomen:  Soft, nt/nd Musculoskeletal:  Warm, FROM Skin:  Warm, superficial ulcers on the b\l LE.   LABS:  CBC  Recent Labs Lab 06/06/15 0240 06/07/15 0315 06/08/15 0304  WBC 8.8 6.9 8.9  HGB 11.1* 9.3* 11.0*  HCT 35.9 29.3* 34.5*  PLT 114* 85* 78*   Coag's No results for input(s): APTT, INR in the last 168 hours. BMET  Recent Labs Lab 06/06/15 0240 06/07/15 0315 06/08/15 0304  NA 148* 146* 146*  K 4.5 4.3 4.1  CL 113* 116* 118*  CO2 BUN 52* 48* 44*  CREATININE 1.45*  1.76* 1.57*  GLUCOSE 103* 116* 222*   Electrolytes  Recent Labs Lab 06/06/15 0240 06/07/15 0315 06/08/15 0304  CALCIUM 11.4* 10.6* 10.9*   Sepsis Markers  Recent Labs Lab 06/05/15 2131 06/05/15 2249 06/06/15 0240  LATICACIDVEN 1.5 1.1 2.3*   ABG  Recent Labs Lab 06/05/15 2326 06/07/15 1054 06/08/15 0509  PHART 7.52* 7.38 7.42  PCO2ART 38 43 38  PO2ART 225* 98 145*   Liver Enzymes  Recent Labs Lab 06/05/15 2140 06/06/15 0240  AST 16 18  ALT 18 14  ALKPHOS 106 76  BILITOT 1.3* 0.9  ALBUMIN 3.6 2.6*   Cardiac Enzymes  Recent Labs Lab 06/05/15 2140  TROPONINI 0.04*   Glucose  Recent Labs Lab 06/07/15 0034 06/07/15 2009 06/08/15 0034 06/08/15 0423 06/08/15 0745 06/08/15 1149  GLUCAP 103* 174* 182* 208* 182* 185*    Imaging Dg Chest Port 1 View  06/08/2015   CLINICAL DATA:  Acute on chronic respiratory failure.  Sepsis.  EXAM: PORTABLE CHEST - 1 VIEW  COMPARISON:  06/05/2015.  FINDINGS: 0608 hr. The endotracheal tube tip is 2 cm above the carina. Nasogastric tube is looped within a large hiatal hernia and its tip does not extend below the diaphragm. The heart size and mediastinal contours are stable. There are developing bibasilar airspace opacities and probable bilateral pleural  effusions. No edema or pneumothorax identified. The bones appear unchanged.  IMPRESSION: Developing bibasilar airspace opacities and probable bilateral pleural effusions. Large hiatal hernia in which the nasogastric tube is looped.   Electronically Signed   By: Carey Bullocks M.D.   On: 06/08/2015 07:39     ASSESSMENT / PLAN: 81 year old female past medical history of stage III COPD admitted for severe altered mental status/somnolence/cellulitis, and respiratory failure, intubated in the ED and transferred to the ICU.  PULMONARY  A: Acute on chronic hypercapnic respiratory failure AE COPD CO2 narcosis P:  Continue with mechanical ventilation, wean as  tolerated Spontaneous breathing trials daily and as tolerated Maintain O2 saturations greater than 88% Continue with nebulizers, steroids, broncho- dilators Check ABG today  CARDIOVASCULAR CVL-none A: Hypotension P:  Monitor sedation and analgesia Maintain SBP greater than 90 Maintain map greater than 65 Hold on any antihypertensives medication DVT Prophylaxis  RENAL A: Acute kidney injury - slightly worst today Hypernatremia - improving P:  Most likely related to hypotension Continue with gentle IV fluids, and gentle fluid boluses Monitor electrolytes ICU electrolyte replacement protocol  GASTROINTESTINAL P:  Continue with GI prophylaxis Cont with TF   INFECTIOUS A: Bilateral lower extremity cellulitis P:  BCx2 - NTD UC  Sputum - pending Abx: Vanc, start date 8/12 day 2/ Leva, start date 8/14 day 1/ NEUROLOGIC A: Altered mental status/somnolence P:  Secondary to CO2 narcosis Continue with COPD management, monitor ABG, monitor neuro status, RASS 0 to -1 precedex and prn Fentanyl while on the vent   Patient to follow up with Dr. Meredeth Ide after discharge.   I have personally obtained a history, examined the patient, evaluated laboratory and imaging results, formulated the assessment and plan and placed orders.  The Patient requires high complexity decision making for assessment and support, frequent evaluation and titration of therapies, application of advanced monitoring technologies and extensive interpretation of multiple databases. Critical Care Time devoted to patient care services described in this note is 35 minutes.   Overall, patient is critically ill, prognosis is guarded. Patient at high risk for cardiac arrest and death.     Lucie Leather, M.D.  [facility]  Medical Director [facility] Medical Director [facility] Cardio-Pulmonary Department

## 2015-06-08 NOTE — Progress Notes (Signed)
Nutrition Follow-up   INTERVENTION:   EN: recommend continuing current TF regimen, continue to assess   NUTRITION DIAGNOSIS:   Inadequate oral intake related to acute illness as evidenced by NPO status. Being addressed via TF  GOAL:   Provide needs based on ASPEN/SCCM guidelines   MONITOR:    (Energy Intake, EN, Digestive System, Electrolyte/Renal Profile, Glucose Profile, Pulmonary)  ASSESSMENT:    Pt remains on vent, less agitated this AM, attempting weaning trial   Diet Order:  Diet NPO time specified Except for: Sips with Meds   EN: tolerating TF at goal rate  Digestive System: no signs of TF intolerance, no BM  Skin:  Reviewed, no issues  Last BM:  8/12   Electrolyte and Renal Profile:  Recent Labs Lab 06/06/15 0240 06/07/15 0315 06/08/15 0304  BUN 52* 48* 44*  CREATININE 1.45* 1.76* 1.57*  NA 148* 146* 146*  K 4.5 4.3 4.1   Glucose Profile:  Recent Labs  06/08/15 0423 06/08/15 0745 06/08/15 1149  GLUCAP 208* 182* 185*   Meds:  NS at 100 ml.hr d/c during ICU rounds, constipation prevention protocol ordered as well  Height:   Ht Readings from Last 1 Encounters:  06/05/15  (1.6 m)    Weight:   Wt Readings from Last 1 Encounters:  06/08/15 143 lb 4.8 oz (65 kg)   , Filed Weights   06/05/15 2033 06/07/15 0230 06/08/15 0438  Weight: 125 lb 3.5 oz (56.799 kg) 137 lb 9.1 oz (62.4 kg) 143 lb 4.8 oz (65 kg)    BMI:  Body mass index is 25.39 kg/(m^2).  Estimated Nutritional Needs:   Kcal:  1438 kcals (ve: 6.6, Tmax: 38)  Protein:  86-114 g(1.5-2.0 g/kg)   Fluid:  1425-1710 mL (25-30 ml/kg)   HIGH Care Level  Romelle Starcher MS, RD, LDN [phone] Pager

## 2015-06-08 NOTE — Care Management Note (Signed)
Case Management Note  Patient Details  Name: Priscilla Powers MRN: [id number] Date of Birth: 03/24/1930  Subjective/Objective:                  Met with patient's sister and patient's HCPOA/daughter Priscilla to discuss discharge planning. Patient was from home with Priscilla and ambulated with a walker. Her PCP is Dr. Phillips of [place]. She is open to [facility] and Priscilla said that home is just not a good plan for her at discharge. Patient is vented currently with noted tremors.  Action/Plan:  RNCM will continue to follow. I explained to Priscilla that if rehab is needed in a SNF she would need to be able to work with PT and now is not appropriate. She agreed. She understands that her blue cross blue shield would need to authorize SNF. CSW consult will be needed when it is decided that PT can work with patient.   Expected Discharge Date:                  Expected Discharge Plan:     In-House Referral:     Discharge planning Services  CM Consult  Post Acute Care Choice:    Choice offered to:  Adult Children (Priscilla Powers HCPOA- daughter [phone])  DME Arranged:    DME Agency:     HH Arranged:    HH Agency:     Status of Service:  In process, will continue to follow  Medicare Important Message Given:    Date Medicare IM Given:    Medicare IM give by:    Date Additional Medicare IM Given:    Additional Medicare Important Message give by:     If discussed at Long Length of Stay Meetings, dates discussed:    Additional Comments:  Angela Johnson, RN 06/08/2015, 12:16 PM  

## 2015-06-08 NOTE — Progress Notes (Signed)
Inpatient Diabetes Program Recommendations  AACE/ADA: New Consensus Statement on Inpatient Glycemic Control (2013)  Target Ranges:  Prepandial:   less than 140 mg/dL      Peak postprandial:   less than 180 mg/dL (1-2 hours)      Critically ill patients:  140 - 180 mg/dL   Review of Glycemic control:  Results for DOREA, DUFF (MRN [id number]) as of 06/08/2015 15:31  Ref. Range 06/07/2015 20:09 06/08/2015 00:34 06/08/2015 04:23 06/08/2015 07:45 06/08/2015 11:49  Glucose-Capillary Latest Ref Range: 65-99 mg/dL 782 (H) 956 (H) 213 (H) 182 (H) 185 (H)   Diabetes history: None noted  Note that CBG's greater than goal.  Please consider adding ICU glycemic control protocol Phase 1-Subcutaneous insulin.   Thanks, Beryl Meager, RN, BC-ADM Inpatient Diabetes Coordinator Pager [phone] (8a-5p)

## 2015-06-09 ENCOUNTER — Inpatient Hospital Stay: Payer: Medicare Other

## 2015-06-09 ENCOUNTER — Inpatient Hospital Stay
Admit: 2015-06-09 | Discharge: 2015-06-09 | Disposition: A | Discharge status: Home / Self Care | Payer: Medicare Other | Attending: Internal Medicine | Admitting: Internal Medicine

## 2015-06-09 DIAGNOSIS — J9602 Acute respiratory failure with hypercapnia: Secondary | ICD-10-CM

## 2015-06-09 LAB — GLUCOSE, CAPILLARY
GLUCOSE-CAPILLARY: 138 mg/dL — AB (ref 65–99)
Glucose-Capillary: 160 mg/dL — ABNORMAL HIGH (ref 65–99)
Glucose-Capillary: 128 mg/dL — ABNORMAL HIGH (ref 65–99)
Glucose-Capillary: 133 mg/dL — ABNORMAL HIGH (ref 65–99)

## 2015-06-09 LAB — CULTURE, RESPIRATORY

## 2015-06-09 LAB — BRAIN NATRIURETIC PEPTIDE: B NATRIURETIC PEPTIDE 5: 172 pg/mL — AB (ref 0.0–100.0)

## 2015-06-09 LAB — WOUND CULTURE: SPECIAL REQUESTS: NORMAL

## 2015-06-09 LAB — BASIC METABOLIC PANEL
ANION GAP: 4 — AB (ref 5–15)
BUN: 46 mg/dL — ABNORMAL HIGH (ref 6–20)
CHLORIDE: 117 mmol/L — AB (ref 101–111)
CO2: 26 mmol/L (ref 22–32)
Calcium: 11.4 mg/dL — ABNORMAL HIGH (ref 8.9–10.3)
Creatinine, Ser: 1.34 mg/dL — ABNORMAL HIGH (ref 0.44–1.00)
GFR calc non Af Amer: 36 mL/min — ABNORMAL LOW (ref >60)
GFR, EST AFRICAN AMERICAN: 41 mL/min — AB (ref >60)
GLUCOSE: 154 mg/dL — AB (ref 65–99)
POTASSIUM: 3.7 mmol/L (ref 3.5–5.1)
Sodium: 147 mmol/L — ABNORMAL HIGH (ref 135–145)

## 2015-06-09 LAB — CULTURE, RESPIRATORY W GRAM STAIN

## 2015-06-09 LAB — MAGNESIUM: Magnesium: 1.9 mg/dL (ref 1.7–2.4)

## 2015-06-09 MED ORDER — MIDAZOLAM HCL 2 MG/2ML IJ SOLN
INTRAMUSCULAR | Status: AC
  Administered 2015-06-09: 4 mg via INTRAVENOUS
  Filled 2015-06-09: qty 4

## 2015-06-09 MED ORDER — FUROSEMIDE 10 MG/ML IJ SOLN
20.0000 mg | Freq: Two times a day (BID) | INTRAMUSCULAR | Status: DC
  Administered 2015-06-09 – 2015-06-10 (×3): 20 mg via INTRAVENOUS
  Filled 2015-06-09 (×3): qty 2

## 2015-06-09 MED ORDER — BISACODYL 10 MG RE SUPP
10.0000 mg | Freq: Once | RECTAL | Status: AC
  Administered 2015-06-09: 10 mg via RECTAL
  Filled 2015-06-09: qty 1

## 2015-06-09 MED ORDER — NOREPINEPHRINE 4 MG/250ML-% IV SOLN: 0.0000 ug/min | INTRAVENOUS | Status: DC | PRN

## 2015-06-09 MED ORDER — MIDAZOLAM HCL 2 MG/2ML IJ SOLN
4.0000 mg | Freq: Once | INTRAMUSCULAR | Status: AC
  Administered 2015-06-09: 4 mg via INTRAVENOUS

## 2015-06-09 MED ORDER — METHYLPREDNISOLONE SODIUM SUCC 40 MG IJ SOLR
20.0000 mg | Freq: Every day | INTRAMUSCULAR | Status: DC
  Administered 2015-06-10: 20 mg via INTRAVENOUS
  Filled 2015-06-09: qty 1

## 2015-06-09 MED ORDER — VECURONIUM BROMIDE 10 MG IV SOLR
INTRAVENOUS | Status: AC
  Administered 2015-06-09: 10 mg via INTRAVENOUS
  Filled 2015-06-09: qty 10

## 2015-06-09 MED ORDER — ENOXAPARIN SODIUM 30 MG/0.3ML ~~LOC~~ SOLN
30.0000 mg | SUBCUTANEOUS | Status: DC
  Administered 2015-06-10: 30 mg via SUBCUTANEOUS
  Filled 2015-06-09: qty 0.3

## 2015-06-09 MED ORDER — ENOXAPARIN SODIUM 40 MG/0.4ML ~~LOC~~ SOLN
40.0000 mg | SUBCUTANEOUS | Status: DC
  Administered 2015-06-09: 40 mg via SUBCUTANEOUS
  Filled 2015-06-09: qty 0.4

## 2015-06-09 MED ORDER — MIDAZOLAM HCL 2 MG/2ML IJ SOLN
4.0000 mg | INTRAMUSCULAR | Status: AC
  Administered 2015-06-09: 4 mg via INTRAVENOUS

## 2015-06-09 MED ORDER — AMIODARONE HCL IN DEXTROSE 360-4.14 MG/200ML-% IV SOLN
30.0000 mg/h | INTRAVENOUS | Status: DC
  Filled 2015-06-09 (×3): qty 200

## 2015-06-09 MED ORDER — OXYCODONE HCL 5 MG PO TABS
2.5000 mg | ORAL_TABLET | ORAL | Status: DC
  Administered 2015-06-09 – 2015-06-10 (×5): 2.5 mg via ORAL
  Filled 2015-06-09 (×5): qty 1

## 2015-06-09 MED ORDER — FAMOTIDINE 20 MG PO TABS: 20.0000 mg | ORAL_TABLET | Freq: Every day | ORAL | Status: DC

## 2015-06-09 MED ORDER — AMIODARONE HCL IN DEXTROSE 360-4.14 MG/200ML-% IV SOLN
60.0000 mg/h | INTRAVENOUS | Status: AC
  Administered 2015-06-09 (×2): 60 mg/h via INTRAVENOUS
  Filled 2015-06-09 (×2): qty 200

## 2015-06-09 MED ORDER — VECURONIUM BROMIDE 10 MG IV SOLR
10.0000 mg | Freq: Once | INTRAVENOUS | Status: AC
  Administered 2015-06-09: 10 mg via INTRAVENOUS

## 2015-06-09 MED ORDER — FENTANYL CITRATE (PF) 100 MCG/2ML IJ SOLN
50.0000 ug | INTRAMUSCULAR | Status: DC | PRN
  Administered 2015-06-09 – 2015-06-10 (×7): 50 ug via INTRAVENOUS
  Filled 2015-06-09 (×6): qty 2

## 2015-06-09 MED ORDER — NOREPINEPHRINE BITARTRATE 1 MG/ML IV SOLN
0.0000 ug/min | INTRAVENOUS | Status: DC | PRN
  Filled 2015-06-09: qty 4

## 2015-06-09 MED ORDER — IBUPROFEN 400 MG PO TABS
400.0000 mg | ORAL_TABLET | Freq: Four times a day (QID) | ORAL | Status: DC | PRN
  Administered 2015-06-09: 400 mg via ORAL
  Filled 2015-06-09: qty 1

## 2015-06-09 MED ORDER — AMIODARONE IV BOLUS ONLY 150 MG/100ML
150.0000 mg | Freq: Once | INTRAVENOUS | Status: AC
  Administered 2015-06-09: 150 mg via INTRAVENOUS

## 2015-06-09 MED ORDER — CLONAZEPAM 0.5 MG PO TABS
0.5000 mg | ORAL_TABLET | Freq: Two times a day (BID) | ORAL | Status: DC
  Administered 2015-06-09 (×2): 0.5 mg via ORAL
  Filled 2015-06-09 (×2): qty 1

## 2015-06-09 NOTE — Progress Notes (Signed)
Paged Dr. Retta Mac. Informed him of pt still having elevated temp in spite of 2 rounds of tylenol. He stated to give pt dose of motrin 400 mg Q6H PRN for fever greater than 101.6. Derald Macleod, RN

## 2015-06-09 NOTE — Progress Notes (Signed)
Patient's cert period ended today. Will need new orders & F2F at discharge if patient will need [facility] services at discharge.

## 2015-06-09 NOTE — Procedures (Signed)
Central Venous Catheter Placement: Indication: Patient receiving vesicant or irritant drug.; Patient receiving intravenous therapy for longer than 5 days.; Patient has limited or no vascular access.   Consent:verbal/written  Risks and benefits explained in detail including risk of infection, bleeding, respiratory failure and death..   Hand washing performed prior to starting the procedure.   Procedure: An active timeout was performed and correct patient, name, & ID confirmed.  After explaining risk and benefits, patient was positioned correctly for central venous access. Patient was prepped using strict sterile technique including chlorohexadine preps, sterile drape, sterile gown and sterile gloves.  The area was prepped, draped and anesthetized in the usual sterile manner. Patient comfort was obtained.  A triple lumen catheter was placed in LEFT Internal Jugular Vein There was good blood return, catheter caps were placed on lumens, catheter flushed easily, the line was secured and a sterile dressing and BIO-PATCH applied.   Ultrasound was used to visualize vasculature and guidance of needle.   Number of Attempts: 1 Complications:none Estimated Blood Loss: none Chest Radiograph indicated and ordered.  Procedure time:8 mins Operator: Tyion Boylen.   Lucie Leather, M.D.  [facility]  Medical Director [facility] Medical Director [facility] Cardio-Pulmonary Department

## 2015-06-09 NOTE — Progress Notes (Signed)
Paged Dr. Belia Heman in regards to pt is now in Afib with RVR. Rate into the 160's. Respirations increased into 30-35. Slight tugging noticed supraclavicular. Dr. Belia Heman ordered 4 versed, and 100 Fentanyl now. Derald Macleod, RN 06/09/2015 9:20 AM

## 2015-06-09 NOTE — Progress Notes (Signed)
[facility] [place] at [facility]                                                                                                                                                                                            Patient Demographics                                            Patient Demographics   Priscilla Powers, is a 81 y.o. female, DOB - 02-Nov-1929, ZOX:[id number]  Admit date - 06/05/2015   Admitting Physician Wyatt Haste, MD  Outpatient Primary MD for the patient is No primary care provider on file.   LOS - 4  Subjective:  Sedation was held yesterday and spontaneous breathing trials were done patient became to Make heart rate increased. She also started having fevers Review of Systems:   CONSTITUTIONAL: fever Unable to provide any further review of systems due to patient being on the ventilator  Vitals:   Filed Vitals:   06/09/15 1000 06/09/15 1050 06/09/15 1100 06/09/15 1347  BP: 118/65  136/68   Pulse:      Temp: 101.3 F (38.5 C) 101.1 F (38.4 C) 101.1 F (38.4 C)   TempSrc:      Resp: 14 16 14    Height:      Weight:      SpO2:    100%    Wt Readings from Last 3 Encounters:  06/09/15 64.1 kg (141 lb 5 oz)  05/15/15 61.689 kg (136 lb)  05/12/15 62.143 kg (137 lb)     Intake/Output Summary (Last 24 hours) at 06/09/15 1451 Last data filed at 06/09/15 0900  Gross per 24 hour  Intake  708.6 ml  Output   1800 ml  Net -1091.4 ml    Physical Exam:   GENERAL: Critically ill-appearing female on the ventilator  HEAD, EYES, EARS, NOSE AND THROAT: Atraumatic, normocephalic. . Pupils equal and reactive to light. Sclerae anicteric. No conjunctival injection. No oro-pharyngeal erythema.  NECK: Supple. There is no jugular venous distention. No bruits, no lymphadenopathy, no thyromegaly.  HEART: Regular rate and rhythm,. No murmurs, no rubs, no clicks.  LUNGS: Clear to auscultation bilaterally. No rales or rhonchi. No wheezes.  ABDOMEN:  Soft, flat, nontender, nondistended. Has good bowel sounds. No hepatosplenomegaly appreciated.  EXTREMITIES: No evidence of any cyanosis, clubbing,  1+ l edema.  +2 pedal and radial pulses bilaterally.  NEUROLOGIC: Sedated on the ventilator SKIN: Bruising in the upper extremities Psych: Sedated and  on the ventilator LN: No inguinal LN enlargement    Antibiotics   Anti-infectives    Start     Dose/Rate Route Frequency Ordered Stop   06/07/15 1415  Levofloxacin (LEVAQUIN) IVPB 250 mg  Status:  Discontinued     250 mg 50 mL/hr over 60 Minutes Intravenous Every 24 hours 06/06/15 1412 06/09/15 1032   06/06/15 2300  vancomycin (VANCOCIN) IVPB 750 mg/150 ml premix     750 mg 150 mL/hr over 60 Minutes Intravenous Every 36 hours 06/06/15 0037     06/06/15 1415  levofloxacin (LEVAQUIN) IVPB 500 mg     500 mg 100 mL/hr over 60 Minutes Intravenous  Once 06/06/15 1412 06/06/15 1543   06/06/15 1345  levofloxacin (LEVAQUIN) IVPB 500 mg  Status:  Discontinued     500 mg 100 mL/hr over 60 Minutes Intravenous Every 24 hours 06/06/15 1344 06/06/15 1410   06/06/15 0000  vancomycin (VANCOCIN) IVPB 1000 mg/200 mL premix  Status:  Discontinued     1,000 mg 200 mL/hr over 60 Minutes Intravenous  Once 06/05/15 2357 06/05/15 2357   06/05/15 2330  vancomycin (VANCOCIN) IVPB 1000 mg/200 mL premix     1,000 mg 200 mL/hr over 60 Minutes Intravenous  Once 06/05/15 2316 06/06/15 0133      Medications   Scheduled Meds: . antiseptic oral rinse  7 mL Mouth Rinse q12n4p  . budesonide (PULMICORT) nebulizer solution  0.5 mg Nebulization BID  . chlorhexidine  15 mL Mouth Rinse BID  . Chlorhexidine Gluconate Cloth  6 each Topical Q0600  . clonazePAM  0.5 mg Oral BID  . enoxaparin (LOVENOX) injection  40 mg Subcutaneous Q24H  . [START ON 06/10/2015] famotidine  20 mg Oral Daily  . feeding supplement (VITAL AF 1.2 CAL)  1,000 mL Per Tube Q24H  . free water  25 mL Per Tube 6 times per day  . furosemide  20 mg  Intravenous Q12H  . ipratropium-albuterol  3 mL Nebulization Q6H  . [START ON 06/10/2015] methylPREDNISolone (SOLU-MEDROL) injection  20 mg Intravenous Daily  . mupirocin ointment  1 application Nasal BID  . oxyCODONE  2.5 mg Oral Q4H  . senna-docusate  2 tablet Oral BID  . sodium chloride  1,000 mL Intravenous Once  . sodium chloride  3 mL Intravenous Q12H  . vancomycin  750 mg Intravenous Q36H   Continuous Infusions:   PRN Meds:.acetaminophen **OR** acetaminophen, fentaNYL (SUBLIMAZE) injection, ondansetron **OR** ondansetron (ZOFRAN) IV   Data Review:   Micro Results Recent Results (from the past 240 hour(s))  MRSA PCR Screening     Status: Abnormal   Collection Time: 06/05/15  8:16 PM  Result Value Ref Range Status   MRSA by PCR POSITIVE (A) NEGATIVE Final    Comment:        The GeneXpert MRSA Assay (FDA approved for NASAL specimens only), is one component of a comprehensive MRSA colonization surveillance program. It is not intended to diagnose MRSA infection nor to guide or monitor treatment for MRSA infections. CRITICAL RESULT CALLED TO, READ BACK BY AND VERIFIED WITH: CHRISTY AUSTIN AT 0443 06/06/15 SDR   Wound culture     Status: None   Collection Time: 06/05/15  8:54 PM  Result Value Ref Range Status   Specimen Description ABSCESS  Final   Special Requests Normal  Final   Gram Stain RARE WBC SEEN FEW GRAM POSITIVE COCCI IN PAIRS   Final   Culture   Final    HEAVY GROWTH METHICILLIN  RESISTANT STAPHYLOCOCCUS AUREUS CRITICAL RESULT CALLED TO, READ BACK BY AND VERIFIED WITH: ADAM SCARBOROUGH AT 1126 06/07/15 DV    Report Status 06/09/2015 FINAL  Final   Organism ID, Bacteria METHICILLIN RESISTANT STAPHYLOCOCCUS AUREUS  Final      Susceptibility   Methicillin resistant staphylococcus aureus - MIC*    CIPROFLOXACIN >=8 RESISTANT Resistant     GENTAMICIN <=0.5 SENSITIVE Sensitive     OXACILLIN >=4 RESISTANT Resistant     VANCOMYCIN <=0.5 SENSITIVE Sensitive      TRIMETH/SULFA <=10 SENSITIVE Sensitive     CEFOXITIN SCREEN Value in next row Resistant      POSITIVECEFOXITIN SCREEN - This test may be used to predict mecA-mediated oxacillin resistance, and it is based on the cefoxitin disk screen test.  The cefoxitin screen and oxacillin work in combination to determine the final interpretation reported for oxacillin.     Inducible Clindamycin Value in next row Resistant      POSITIVEINDUCIBLE CLINDAMYCIN RESISTANCE - A positive ICR test is indicative of inducible resistance to macrolides, lincosamides, and type B streptogramin.  This isolate is presumed to be resistant to Clindamycin, however, Clindamycin may still be effective in some patients.     TETRACYCLINE Value in next row Sensitive      SENSITIVE<=1    * HEAVY GROWTH METHICILLIN RESISTANT STAPHYLOCOCCUS AUREUS  Blood culture (routine x 2)     Status: None (Preliminary result)   Collection Time: 06/05/15  9:27 PM  Result Value Ref Range Status   Specimen Description BLOOD  Final   Special Requests BOTTLES DRAWN AEROBIC AND ANAEROBIC 4CC  Final   Culture NO GROWTH 4 DAYS  Final   Report Status PENDING  Incomplete  Blood culture (routine x 2)     Status: None (Preliminary result)   Collection Time: 06/05/15 10:49 PM  Result Value Ref Range Status   Specimen Description BLOOD  Final   Special Requests BOTTLES DRAWN AEROBIC AND ANAEROBIC 2CC  Final   Culture  Setup Time   Final    GRAM POSITIVE COCCI AEROBIC BOTTLE ONLY CRITICAL RESULT CALLED TO, READ BACK BY AND VERIFIED WITH: ADAM SCARBOROUGH AT 1244 06/07/15 DV    Culture   Final    COAGULASE NEGATIVE STAPHYLOCOCCUS AEROBIC BOTTLE ONLY Results consistent with contamination.    Report Status PENDING  Incomplete  Culture, respiratory (NON-Expectorated)     Status: None   Collection Time: 06/06/15 12:40 PM  Result Value Ref Range Status   Specimen Description TRACHEAL ASPIRATE  Final   Special Requests NONE  Final   Gram Stain   Final     MODERATE WBC SEEN FEW GRAM NEGATIVE RODS GOOD SPECIMEN - 80-90% WBCS    Culture   Final    LIGHT GROWTH METHICILLIN RESISTANT STAPHYLOCOCCUS AUREUS CRITICAL VALUE NOTED.  VALUE IS CONSISTENT WITH PREVIOUSLY REPORTED AND CALLED VALUE.    Report Status 06/09/2015 FINAL  Final   Organism ID, Bacteria METHICILLIN RESISTANT STAPHYLOCOCCUS AUREUS  Final      Susceptibility   Methicillin resistant staphylococcus aureus - MIC*    CIPROFLOXACIN >=8 RESISTANT Resistant     GENTAMICIN <=0.5 SENSITIVE Sensitive     OXACILLIN 1 RESISTANT Resistant     VANCOMYCIN 1 SENSITIVE Sensitive     TRIMETH/SULFA <=10 SENSITIVE Sensitive     CEFOXITIN SCREEN Value in next row Resistant      POSITIVECEFOXITIN SCREEN - This test may be used to predict mecA-mediated oxacillin resistance, and it is based on the cefoxitin  disk screen test.  The cefoxitin screen and oxacillin work in combination to determine the final interpretation reported for oxacillin.     Inducible Clindamycin Value in next row Resistant      POSITIVEINDUCIBLE CLINDAMYCIN RESISTANCE - A positive ICR test is indicative of inducible resistance to macrolides, lincosamides, and type B streptogramin.  This isolate is presumed to be resistant to Clindamycin, however, Clindamycin may still be effective in some patients.     TETRACYCLINE Value in next row Sensitive      SENSITIVE<=1    * LIGHT GROWTH METHICILLIN RESISTANT STAPHYLOCOCCUS AUREUS    Radiology Reports Dg Chest 1 View  06/05/2015   CLINICAL DATA:  Endotracheal tube placement.  Initial encounter.  EXAM: CHEST  1 VIEW  COMPARISON:  Chest radiograph performed earlier today at 8:45 p.m.  FINDINGS: The patient's endotracheal tube is seen ending 4 cm above the carina. An enteric tube is noted extending below the diaphragm.  The lungs appear clear bilaterally. No focal consolidation, pleural effusion or pneumothorax is seen. Mild scarring is suggested at the right lung apex, though this may reflect  overlying tubing.  The cardiomediastinal silhouette is borderline enlarged. No acute osseous abnormalities are identified. A large paraesophageal hernia is again noted.  IMPRESSION: 1. Endotracheal tube seen ending 4 cm above the carina. 2. Lungs clear bilaterally. 3. Borderline cardiomegaly. 4. Large paraesophageal hernia again noted.   Electronically Signed   By: Roanna Raider M.D.   On: 06/05/2015 23:57   Dg Chest 1 View  06/05/2015   CLINICAL DATA:  Altered mental status. Weakness. Shortness of breath.  EXAM: CHEST  1 VIEW  COMPARISON:  One-view chest x-ray 02/22/2014.  FINDINGS: Cardiomegaly is again noted. Lung volumes are low. This is a reversed lordotic view. No focal airspace disease is evident. The visualized soft tissues and bony thorax are unremarkable.  IMPRESSION: 1. Stable cardiomegaly without failure.   Electronically Signed   By: Marin Roberts M.D.   On: 06/05/2015 21:22   Dg Lumbar Spine Complete  05/13/2015   CLINICAL DATA:  Acute onset of lower back pain.  Initial encounter.  EXAM: LUMBAR SPINE - COMPLETE 4+ VIEW  COMPARISON:  Lumbar spine radiographs and CT performed 03/03/2010, and lumbar spine MRI performed 03/04/2010  FINDINGS: There is no evidence of acute fracture or subluxation. There appears to be significant chronic compression deformity of vertebral body L1, worsened from prior studies. Vertebral bodies demonstrate normal alignment. Mild disc space narrowing is noted at L5-S1.  The visualized bowel gas pattern is unremarkable in appearance; air and stool are noted within the colon. The sacroiliac joints are within normal limits. Scattered vascular calcifications are seen.  IMPRESSION: 1. No evidence of acute fracture or subluxation along the lumbar spine. 2. Significant chronic compression deformity of vertebral body L1, worsened from prior studies.   Electronically Signed   By: Roanna Raider M.D.   On: 05/13/2015 02:23   Ct Head Wo Contrast  06/06/2015   CLINICAL  DATA:  Acute onset of altered mental status. High blood pressure. Initial encounter.  EXAM: CT HEAD WITHOUT CONTRAST  TECHNIQUE: Contiguous axial images were obtained from the base of the skull through the vertex without intravenous contrast.  COMPARISON:  None.  FINDINGS: There is no evidence of acute infarction, mass lesion, or intra- or extra-axial hemorrhage on CT.  Prominence of the ventricles and sulci likely reflects mild cortical volume loss. Scattered periventricular and subcortical white matter change reflects small vessel ischemic microangiopathy. Mild cerebellar  atrophy is noted. Chronic ischemic change is noted at the external capsule bilaterally.  The brainstem and fourth ventricle are within normal limits. The basal ganglia are unremarkable in appearance. The cerebral hemispheres demonstrate grossly normal gray-white differentiation. No mass effect or midline shift is seen.  There is no evidence of fracture; visualized osseous structures are unremarkable in appearance. The visualized portions of the orbits are within normal limits. The paranasal sinuses and mastoid air cells are well-aerated. No significant soft tissue abnormalities are seen.  IMPRESSION: 1. No acute intracranial pathology seen on CT. 2. Mild cortical volume loss and scattered small vessel ischemic microangiopathy.   Electronically Signed   By: Roanna Raider M.D.   On: 06/06/2015 02:15   Dg Chest Port 1 View  06/08/2015   CLINICAL DATA:  Acute on chronic respiratory failure.  Sepsis.  EXAM: PORTABLE CHEST - 1 VIEW  COMPARISON:  06/05/2015.  FINDINGS: 0608 hr. The endotracheal tube tip is 2 cm above the carina. Nasogastric tube is looped within a large hiatal hernia and its tip does not extend below the diaphragm. The heart size and mediastinal contours are stable. There are developing bibasilar airspace opacities and probable bilateral pleural effusions. No edema or pneumothorax identified. The bones appear unchanged.  IMPRESSION:  Developing bibasilar airspace opacities and probable bilateral pleural effusions. Large hiatal hernia in which the nasogastric tube is looped.   Electronically Signed   By: Carey Bullocks M.D.   On: 06/08/2015 07:39     CBC  Recent Labs Lab 06/05/15 2026 06/06/15 0240 06/07/15 0315 06/08/15 0304  WBC 14.0* 8.8 6.9 8.9  HGB 14.5 11.1* 9.3* 11.0*  HCT 46.0 35.9 29.3* 34.5*  PLT 181 114* 85* 78*  MCV 94.5 96.2 93.9 94.0  MCH 29.7 29.9 29.9 29.9  MCHC 31.4* 31.1* 31.8* 31.8*  RDW 18.2* 17.0* 17.7* 17.5*  LYMPHSABS 1.2  --   --   --   MONOABS 1.0*  --   --   --   EOSABS 0.0  --   --   --   BASOSABS 0.0  --   --   --     Chemistries   Recent Labs Lab 06/05/15 2140 06/06/15 0240 06/07/15 0315 06/08/15 0304 06/09/15 1215  NA 148* 148* 146* 146* 147*  K 5.0 4.5 4.3 4.1 3.7  CL 107 113* 116* 118* 117*  CO2 34* 30 26 24 26   GLUCOSE 105* 103* 116* 222* 154*  BUN 57* 52* 48* 44* 46*  CREATININE 1.59* 1.45* 1.76* 1.57* 1.34*  CALCIUM 13.3* 11.4* 10.6* 10.9* 11.4*  MG  --   --   --   --  1.9  AST 16 18  --   --   --   ALT 18 14  --   --   --   ALKPHOS 106 76  --   --   --   BILITOT 1.3* 0.9  --   --   --    ------------------------------------------------------------------------------------------------------------------ estimated creatinine clearance is 28.7 mL/min (by C-G formula based on Cr of 1.34). ------------------------------------------------------------------------------------------------------------------ No results for input(s): HGBA1C in the last 72 hours. ------------------------------------------------------------------------------------------------------------------ No results for input(s): CHOL, HDL, LDLCALC, TRIG, CHOLHDL, LDLDIRECT in the last 72 hours. ------------------------------------------------------------------------------------------------------------------ No results for input(s): TSH, T4TOTAL, T3FREE, THYROIDAB in the last 72 hours.  Invalid  input(s): FREET3 ------------------------------------------------------------------------------------------------------------------ No results for input(s): VITAMINB12, FOLATE, FERRITIN, TIBC, IRON, RETICCTPCT in the last 72 hours.  Coagulation profile No results for input(s): INR, PROTIME in the last 168  hours.  No results for input(s): DDIMER in the last 72 hours.  Cardiac Enzymes  Recent Labs Lab 06/05/15 2140  TROPONINI 0.04*   ------------------------------------------------------------------------------------------------------------------ Invalid input(s): POCBNP    Assessment & Plan   1. Acute respiratory failure with hypercapnia: This is likely due to combination of acute on chronic COPD exasperation, as well as recent narcotic treatment for her back pain. Continue mechanical ventilation continue nebs and steroids also has MRSA growing from her trach aspirates 2.Sepsis due to MRSA pneumonia and lower extremity cellulitis IV Vanco. Repeat cultures if continues to be febrile will add meropenem to her current regimen 3. Essential hypertension: We will hold Aldactone, Lasix, propranolol given relative hypotension 4. Acute renal failure likely ATN: Labile  5. Miscellaneous heparin for DVT prophylaxis  CODE STATUSDO NOT RESUSCITATE     Code Status Orders        Start     Ordered   06/06/15 1049  Do not attempt resuscitation (DNR)   Continuous    Question Answer Comment  In the event of cardiac or respiratory ARREST Do not call a "code blue"   In the event of cardiac or respiratory ARREST Do not perform Intubation, CPR, defibrillation or ACLS   In the event of cardiac or respiratory ARREST Use medication by any route, position, wound care, and other measures to relive pain and suffering. May use oxygen, suction and manual treatment of airway obstruction as needed for comfort.      06/06/15 1048    Advance Directive Documentation        Most Recent Value   Type of  Advance Directive  Living will, Healthcare Power of Gerrit Friends [Pt's daughter [name] Honeycutt]   Pre-existing out of facility DNR order (yellow form or pink MOST form)     "MOST" Form in Place?        Consults  pulmonary critical care DVT Prophylaxis  heparin  Lab Results  Component Value Date   PLT 78* 06/08/2015     Time Spent in minutes   35 minutes of critical care time spent    Auburn Bilberry M.D on 06/09/2015 at 2:51 PM  Between 7am to 6pm - Pager - [phone]  After 6pm go to [email or web address] - password [email or web address] [facility]  [facility]   Office  [phone]

## 2015-06-09 NOTE — Care Management Important Message (Signed)
Important Message  Patient Details  Name: OCTOBER PEERY MRN: [id number] Date of Birth: [birth year]   Medicare Important Message Given:  Yes-second notification given    Collie Siad, RN 06/09/2015, 11:27 AM

## 2015-06-09 NOTE — Progress Notes (Signed)
PULMONARY / CRITICAL CARE MEDICINE   Name: Priscilla Powers MRN: [id number] DOB: [birth year]    ADMISSION DATE: 06/05/2015 CONSULTATION DATE: 06/06/15  REFERRING MD : Dr. Auburn Bilberry  CHIEF COMPLAINT:  Altered Mental status, weakness   Pulmonologist - Dr. Meredeth Ide  SUBJECTIVE:  No acute events overnight.  Failed SAT yesterday due to delerium, will try SAt/SBt again today.  Patient with acute COPD exacerbation Will meet with daughter today and discuss goals of care  VITAL SIGNS: Temp:  [99 F (37.2 C)-101.5 F (38.6 C)] 101.5 F (38.6 C) (08/16 0700) Pulse Rate:  [65-79] 65 (08/15 2000) Resp:  [6-35] 25 (08/16 0700) BP: (110-150)/(54-120) 110/68 mmHg (08/16 0700) SpO2:  [100 %] 100 % (08/16 0431) FiO2 (%):  [30 %] 30 % (08/16 0431) Weight:  [141 lb 5 oz (64.1 kg)] 141 lb 5 oz (64.1 kg) (08/16 0500) HEMODYNAMICS:   VENTILATOR SETTINGS: Vent Mode:  [-] PRVC FiO2 (%):  [30 %] 30 % Set Rate:  [14 bmp] 14 bmp Vt Set:  [450 mL] 450 mL PEEP:  [5 cmH20] 5 cmH20 Pressure Support:  [8 cmH20] 8 cmH20 INTAKE / OUTPUT:  Intake/Output Summary (Last 24 hours) at 06/09/15 0839 Last data filed at 06/09/15 0800  Gross per 24 hour  Intake   1159 ml  Output   1550 ml  Net   -391 ml    PHYSICAL EXAMINATION: General:  Moderately agitated on the vent, but somnolent at times Neuro:  RASS: 1-3 HEENT:  [place]/AT, no lesions Cardiovascular:  S1, S2, no murmurs Lungs:  Coarse upper airway sounds, fine exp wheezes at the bases Abdomen:  Soft, nt/nd Musculoskeletal:  Warm, FROM Skin:  Warm, superficial ulcers on the b\l LE.   LABS:  CBC  Recent Labs Lab 06/06/15 0240 06/07/15 0315 06/08/15 0304  WBC 8.8 6.9 8.9  HGB 11.1* 9.3* 11.0*  HCT 35.9 29.3* 34.5*  PLT 114* 85* 78*   Coag's No results for input(s): APTT, INR in the last 168 hours. BMET  Recent Labs Lab 06/06/15 0240 06/07/15 0315 06/08/15 0304  NA 148* 146* 146*  K 4.5 4.3 4.1  CL 113* 116* 118*  CO2 BUN 52* 48* 44*  CREATININE 1.45* 1.76* 1.57*  GLUCOSE 103* 116* 222*   Electrolytes  Recent Labs Lab 06/06/15 0240 06/07/15 0315 06/08/15 0304  CALCIUM 11.4* 10.6* 10.9*   Sepsis Markers  Recent Labs Lab 06/05/15 2131 06/05/15 2249 06/06/15 0240  LATICACIDVEN 1.5 1.1 2.3*   ABG  Recent Labs Lab 06/07/15 1054 06/08/15 0509 06/08/15 1600  PHART 7.38 7.42 7.36  PCO2ART 43 38 44  PO2ART 98 145* 105   Liver Enzymes  Recent Labs Lab 06/05/15 2140 06/06/15 0240  AST 16 18  ALT 18 14  ALKPHOS 106 76  BILITOT 1.3* 0.9  ALBUMIN 3.6 2.6*   Cardiac Enzymes  Recent Labs Lab 06/05/15 2140  TROPONINI 0.04*   Glucose  Recent Labs Lab 06/08/15 0745 06/08/15 1149 06/08/15 1646 06/08/15 2032 06/08/15 2359 06/09/15 0356  GLUCAP 182* 185* 164* 151* 138* 160*    Imaging No results found.   ASSESSMENT / PLAN: 81 year old female past medical history of stage III COPD admitted for severe altered mental status/somnolence/cellulitis, and respiratory failure, intubated in the ED and transferred to the ICU.  PULMONARY  A: Acute on chronic hypercapnic respiratory failure AE COPD CO2 narcosis P:  Continue with mechanical ventilation, wean as tolerated Spontaneous breathing trials daily and as tolerated-will plan for trial  of extubation Maintain O2 saturations greater than 88% Continue with nebulizers, steroids, broncho- dilators Check ABG today with SBT/SAT  CARDIOVASCULAR CVL-none A: Hypotension P:  Monitor sedation and analgesia Maintain SBP greater than 90 Maintain map greater than 65 Hold on any antihypertensives medication DVT Prophylaxis  RENAL A: Acute kidney injury - slightly worst today Hypernatremia - improving P:  Most likely related to hypotension Continue with gentle IV fluids, and gentle fluid boluses Monitor electrolytes ICU electrolyte replacement protocol  GASTROINTESTINAL P:  Continue with GI prophylaxis Cont  with TF   INFECTIOUS A: Bilateral lower extremity cellulitis P:  BCx2 - NTD UC  Sputum - pending Abx: Vanc, start date 8/12 day 2/ Leva, start date 8/14 day 1/ NEUROLOGIC A: Altered mental status/somnolence P:  Secondary to CO2 narcosis Continue with COPD management, monitor ABG, monitor neuro status, RASS 0 to -1 precedex and prn Fentanyl while on the vent   Patient to follow up with Dr. Meredeth Ide after discharge.  Plan for SAT/SBt when family arrives  I have personally obtained a history, examined the patient, evaluated laboratory and imaging results, formulated the assessment and plan and placed orders.  The Patient requires high complexity decision making for assessment and support, frequent evaluation and titration of therapies, application of advanced monitoring technologies and extensive interpretation of multiple databases. Critical Care Time devoted to patient care services described in this note is 35 minutes.   Overall, patient is critically ill, prognosis is guarded. Patient at high risk for cardiac arrest and death.     Lucie Leather, M.D.  [facility]  Medical Director [facility] Medical Director [facility] Cardio-Pulmonary Department

## 2015-06-09 NOTE — Progress Notes (Signed)
Nutrition Follow-up    INTERVENTION:   EN: recommend increasing TF to goal rate of 50 ml/hr today as per order; may need to increase free water flushes to better meet hydration needs, BMP pending, continue to assess   NUTRITION DIAGNOSIS:   Inadequate oral intake related to acute illness as evidenced by NPO status.  GOAL:   Provide needs based on ASPEN/SCCM guidelines   MONITOR:    (Energy Intake, EN, Digestive System, Electrolyte/Renal Profile, Glucose Profile, Pulmonary)   ASSESSMENT:    Pt remains on vent   Diet Order:  Diet NPO time specified Except for: Sips with Meds   EN: tolerating Vital 1.2 at rate of 30 ml/hr  Digestive System: no BM, no signs of TF intolerance, on constipation prevention protocol  Electrolyte and Renal Profile: no BMP today  Recent Labs Lab 06/06/15 0240 06/07/15 0315 06/08/15 0304  BUN 52* 48* 44*  CREATININE 1.45* 1.76* 1.57*  NA 148* 146* 146*  K 4.5 4.3 4.1   Glucose Profile:  Recent Labs  06/08/15 2032 06/08/15 2359 06/09/15 0356  GLUCAP 151* 138* 160*   Meds: solumedrol, senokot  Skin:  Reviewed, no issues  Last BM:  8/12  Height:   Ht Readings from Last 1 Encounters:  06/05/15  (1.6 m)    Weight:   Wt Readings from Last 1 Encounters:  06/09/15 141 lb 5 oz (64.1 kg)    BMI:  Body mass index is 25.04 kg/(m^2).  Estimated Nutritional Needs:   Kcal:  1438 kcals (ve: 6.6, Tmax: 38)  Protein:  86-114 g(1.5-2.0 g/kg)   Fluid:  1425-1710 mL (25-30 ml/kg)   HIGH Care Level  Romelle Starcher MS, RD, LDN [phone] Pager

## 2015-06-09 NOTE — Progress Notes (Signed)
*  PRELIMINARY RESULTS* Echocardiogram 2D Echocardiogram has been performed.  Priscilla Powers 06/09/2015, 2:07 PM

## 2015-06-09 NOTE — Progress Notes (Addendum)
ANTIBIOTIC CONSULT NOTE - FOLLOW UP   Pharmacy Consult for vancomycin dosing Indication: sepsis  Allergies  Allergen Reactions  . Penicillins Other (See Comments)    Reaction:  Unknown     Patient Measurements: Height:  (160 cm) Weight: 141 lb 5 oz (64.1 kg) IBW/kg (Calculated) : 52.4 Adjusted Body Weight: 56.8kg  Vital Signs: Temp: 100.9 F (38.3 C) (08/16 1800) BP: 102/55 mmHg (08/16 1800) Pulse Rate: 73 (08/16 1800) Intake/Output from previous day: 08/15 0701 - 08/16 0700 In: 1273.4 [I.V.:483.4; NG/GT:540; IV Piggyback:250] Out: 1550 [Urine:1550] Intake/Output from this shift:    Labs:  Recent Labs  06/07/15 0315 06/08/15 0304 06/09/15 1215  WBC 6.9 8.9  --   HGB 9.3* 11.0*  --   PLT 85* 78*  --   CREATININE 1.76* 1.57* 1.34*   Estimated Creatinine Clearance: 28.7 mL/min (by C-G formula based on Cr of 1.34). No results for input(s): VANCOTROUGH, VANCOPEAK, VANCORANDOM, GENTTROUGH, GENTPEAK, GENTRANDOM, TOBRATROUGH, TOBRAPEAK, TOBRARND, AMIKACINPEAK, AMIKACINTROU, AMIKACIN in the last 72 hours.   Microbiology: Recent Results (from the past 720 hour(s))  MRSA PCR Screening     Status: Abnormal   Collection Time: 06/05/15  8:16 PM  Result Value Ref Range Status   MRSA by PCR POSITIVE (A) NEGATIVE Final    Comment:        The GeneXpert MRSA Assay (FDA approved for NASAL specimens only), is one component of a comprehensive MRSA colonization surveillance program. It is not intended to diagnose MRSA infection nor to guide or monitor treatment for MRSA infections. CRITICAL RESULT CALLED TO, READ BACK BY AND VERIFIED WITH: CHRISTY AUSTIN AT 0443 06/06/15 SDR   Wound culture     Status: None   Collection Time: 06/05/15  8:54 PM  Result Value Ref Range Status   Specimen Description ABSCESS  Final   Special Requests Normal  Final   Gram Stain RARE WBC SEEN FEW GRAM POSITIVE COCCI IN PAIRS   Final   Culture   Final    HEAVY GROWTH METHICILLIN  RESISTANT STAPHYLOCOCCUS AUREUS CRITICAL RESULT CALLED TO, READ BACK BY AND VERIFIED WITH: ADAM SCARBOROUGH AT 1126 06/07/15 DV    Report Status 06/09/2015 FINAL  Final   Organism ID, Bacteria METHICILLIN RESISTANT STAPHYLOCOCCUS AUREUS  Final      Susceptibility   Methicillin resistant staphylococcus aureus - MIC*    CIPROFLOXACIN >=8 RESISTANT Resistant     GENTAMICIN <=0.5 SENSITIVE Sensitive     OXACILLIN >=4 RESISTANT Resistant     VANCOMYCIN <=0.5 SENSITIVE Sensitive     TRIMETH/SULFA <=10 SENSITIVE Sensitive     CEFOXITIN SCREEN Value in next row Resistant      POSITIVECEFOXITIN SCREEN - This test may be used to predict mecA-mediated oxacillin resistance, and it is based on the cefoxitin disk screen test.  The cefoxitin screen and oxacillin work in combination to determine the final interpretation reported for oxacillin.     Inducible Clindamycin Value in next row Resistant      POSITIVEINDUCIBLE CLINDAMYCIN RESISTANCE - A positive ICR test is indicative of inducible resistance to macrolides, lincosamides, and type B streptogramin.  This isolate is presumed to be resistant to Clindamycin, however, Clindamycin may still be effective in some patients.     TETRACYCLINE Value in next row Sensitive      SENSITIVE<=1    * HEAVY GROWTH METHICILLIN RESISTANT STAPHYLOCOCCUS AUREUS  Blood culture (routine x 2)     Status: None (Preliminary result)   Collection Time: 06/05/15  9:27  PM  Result Value Ref Range Status   Specimen Description BLOOD  Final   Special Requests BOTTLES DRAWN AEROBIC AND ANAEROBIC 4CC  Final   Culture NO GROWTH 4 DAYS  Final   Report Status PENDING  Incomplete  Blood culture (routine x 2)     Status: None (Preliminary result)   Collection Time: 06/05/15 10:49 PM  Result Value Ref Range Status   Specimen Description BLOOD  Final   Special Requests BOTTLES DRAWN AEROBIC AND ANAEROBIC 2CC  Final   Culture  Setup Time   Final    GRAM POSITIVE COCCI AEROBIC BOTTLE  ONLY CRITICAL RESULT CALLED TO, READ BACK BY AND VERIFIED WITH: ADAM SCARBOROUGH AT 1244 06/07/15 DV    Culture   Final    COAGULASE NEGATIVE STAPHYLOCOCCUS AEROBIC BOTTLE ONLY Results consistent with contamination.    Report Status PENDING  Incomplete  Culture, respiratory (NON-Expectorated)     Status: None   Collection Time: 06/06/15 12:40 PM  Result Value Ref Range Status   Specimen Description TRACHEAL ASPIRATE  Final   Special Requests NONE  Final   Gram Stain   Final    MODERATE WBC SEEN FEW GRAM NEGATIVE RODS GOOD SPECIMEN - 80-90% WBCS    Culture   Final    LIGHT GROWTH METHICILLIN RESISTANT STAPHYLOCOCCUS AUREUS CRITICAL VALUE NOTED.  VALUE IS CONSISTENT WITH PREVIOUSLY REPORTED AND CALLED VALUE.    Report Status 06/09/2015 FINAL  Final   Organism ID, Bacteria METHICILLIN RESISTANT STAPHYLOCOCCUS AUREUS  Final      Susceptibility   Methicillin resistant staphylococcus aureus - MIC*    CIPROFLOXACIN >=8 RESISTANT Resistant     GENTAMICIN <=0.5 SENSITIVE Sensitive     OXACILLIN 1 RESISTANT Resistant     VANCOMYCIN 1 SENSITIVE Sensitive     TRIMETH/SULFA <=10 SENSITIVE Sensitive     CEFOXITIN SCREEN Value in next row Resistant      POSITIVECEFOXITIN SCREEN - This test may be used to predict mecA-mediated oxacillin resistance, and it is based on the cefoxitin disk screen test.  The cefoxitin screen and oxacillin work in combination to determine the final interpretation reported for oxacillin.     Inducible Clindamycin Value in next row Resistant      POSITIVEINDUCIBLE CLINDAMYCIN RESISTANCE - A positive ICR test is indicative of inducible resistance to macrolides, lincosamides, and type B streptogramin.  This isolate is presumed to be resistant to Clindamycin, however, Clindamycin may still be effective in some patients.     TETRACYCLINE Value in next row Sensitive      SENSITIVE<=1    * LIGHT GROWTH METHICILLIN RESISTANT STAPHYLOCOCCUS AUREUS    Medical History: Past  Medical History  Diagnosis Date  . COPD (chronic obstructive pulmonary disease)   . CHF (congestive heart failure)   . Hypertension     Medications:   Assessment: 81 yo female ICU patient requiring mechanical ventilation being treated for MRSA in wound and sputum culture. Patient previously ordered levofloxacin.   Goal of Therapy:  Vancomycin trough level 15-20 mcg/ml  Plan:  Will continue Vancomycin 750 mg IV q36 hours. Will obtain trough prior to 2230 dose tonight.    Charlies Silvers, PharmD   [phone] Vancomycin trough not drawn - rescheduled for 0818 at 1000.  Caili Escalera A. [place], [place].D. Clinical Pharmacist  06/10/2015

## 2015-06-10 ENCOUNTER — Inpatient Hospital Stay: Payer: Medicare Other

## 2015-06-10 DIAGNOSIS — J15212 Pneumonia due to Methicillin resistant Staphylococcus aureus: Secondary | ICD-10-CM

## 2015-06-10 DIAGNOSIS — J441 Chronic obstructive pulmonary disease with (acute) exacerbation: Secondary | ICD-10-CM

## 2015-06-10 DIAGNOSIS — A419 Sepsis, unspecified organism: Secondary | ICD-10-CM

## 2015-06-10 DIAGNOSIS — J9602 Acute respiratory failure with hypercapnia: Secondary | ICD-10-CM

## 2015-06-10 LAB — BASIC METABOLIC PANEL
ANION GAP: 3 — AB (ref 5–15)
BUN: 57 mg/dL — AB (ref 6–20)
CHLORIDE: 109 mmol/L (ref 101–111)
CO2: 28 mmol/L (ref 22–32)
Calcium: 10.8 mg/dL — ABNORMAL HIGH (ref 8.9–10.3)
Creatinine, Ser: 1.72 mg/dL — ABNORMAL HIGH (ref 0.44–1.00)
GFR calc Af Amer: 30 mL/min — ABNORMAL LOW (ref >60)
GFR calc non Af Amer: 26 mL/min — ABNORMAL LOW (ref >60)
GLUCOSE: 109 mg/dL — AB (ref 65–99)
POTASSIUM: 3.7 mmol/L (ref 3.5–5.1)
Sodium: 140 mmol/L (ref 135–145)

## 2015-06-10 LAB — CBC
HEMATOCRIT: 33.4 % — AB (ref 35.0–47.0)
HEMOGLOBIN: 10.6 g/dL — AB (ref 12.0–16.0)
MCH: 29.8 pg (ref 26.0–34.0)
MCHC: 31.7 g/dL — ABNORMAL LOW (ref 32.0–36.0)
MCV: 93.9 fL (ref 80.0–100.0)
Platelets: 60 10*3/uL — ABNORMAL LOW (ref 150–440)
RBC: 3.56 MIL/uL — AB (ref 3.80–5.20)
RDW: 17.9 % — ABNORMAL HIGH (ref 11.5–14.5)
WBC: 12.5 10*3/uL — ABNORMAL HIGH (ref 3.6–11.0)

## 2015-06-10 LAB — CULTURE, BLOOD (ROUTINE X 2): CULTURE: NO GROWTH

## 2015-06-10 LAB — GLUCOSE, CAPILLARY
GLUCOSE-CAPILLARY: 137 mg/dL — AB (ref 65–99)
GLUCOSE-CAPILLARY: 160 mg/dL — AB (ref 65–99)
Glucose-Capillary: 138 mg/dL — ABNORMAL HIGH (ref 65–99)
Glucose-Capillary: 109 mg/dL — ABNORMAL HIGH (ref 65–99)
Glucose-Capillary: 108 mg/dL — ABNORMAL HIGH (ref 65–99)

## 2015-06-10 MED ORDER — MIDAZOLAM HCL 2 MG/2ML IJ SOLN
2.0000 mg | INTRAMUSCULAR | Status: DC | PRN
  Administered 2015-06-10: 2 mg via INTRAVENOUS
  Filled 2015-06-10: qty 2

## 2015-06-10 MED ORDER — MORPHINE SULFATE (PF) 2 MG/ML IV SOLN
2.0000 mg | INTRAVENOUS | Status: DC | PRN
  Administered 2015-06-12 – 2015-06-13 (×3): 2 mg via INTRAVENOUS
  Filled 2015-06-10 (×3): qty 1

## 2015-06-10 MED ORDER — FAMOTIDINE IN NACL 20-0.9 MG/50ML-% IV SOLN
20.0000 mg | INTRAVENOUS | Status: DC
  Filled 2015-06-10: qty 50

## 2015-06-10 MED ORDER — DEXTROSE-NACL 5-0.45 % IV SOLN
INTRAVENOUS | Status: DC
  Administered 2015-06-10: 12:00:00 via INTRAVENOUS
  Administered 2015-06-11: 75 mL/h via INTRAVENOUS

## 2015-06-10 MED ORDER — FENTANYL 2500MCG IN NS 250ML (10MCG/ML) PREMIX INFUSION
10.0000 ug/h | INTRAVENOUS | Status: DC
  Administered 2015-06-11: 10 ug/h via INTRAVENOUS
  Filled 2015-06-10 (×2): qty 250

## 2015-06-10 MED ORDER — LORAZEPAM 2 MG/ML IJ SOLN
1.0000 mg | INTRAMUSCULAR | Status: DC | PRN
  Administered 2015-06-10: 1 mg via INTRAVENOUS
  Filled 2015-06-10: qty 1

## 2015-06-10 NOTE — Progress Notes (Signed)
   06/10/15 0910  Clinical Encounter Type  Visited With Patient and family together  Visit Type Initial  Consult/Referral To Chaplain  Spiritual Encounters  Spiritual Needs Sacred text;Prayer;Emotional;Grief support  Stress Factors  Patient Stress Factors Exhausted;Health changes;Major life changes  Family Stress Factors Family relationships;Major life changes;Loss  Met w/patient & niece. Provided grief support, spiritual care, and prayer.  [place]. Alana [place] G. [place]

## 2015-06-10 NOTE — Progress Notes (Signed)
PULMONARY / CRITICAL CARE MEDICINE   Name: Priscilla Powers MRN: [id number] DOB: 12-17-1929    ADMISSION DATE: 06/05/2015 CONSULTATION DATE: 06/06/15  REFERRING MD : Dr. Auburn Bilberry  CHIEF COMPLAINT:  Altered Mental status, weakness   Pulmonologist - Dr. Meredeth Ide  SUBJECTIVE:  No acute events overnight.  Failed SAT  due to delerium, FAILED SBT due to increased WOB and afib with RVR Patient with acute COPD exacerbation Unable to wean from vent today, patient with MRSA pneumonia  VITAL SIGNS: Temp:  [99.9 F (37.7 C)-101.3 F (38.5 C)] 100.4 F (38 C) (08/17 1000) Pulse Rate:  [52-130] 66 (08/17 1000) Resp:  [14-28] 16 (08/17 1000) BP: (71-136)/(50-95) 99/58 mmHg (08/17 1000) SpO2:  [98 %-100 %] 100 % (08/17 1000) FiO2 (%):  [30 %] 30 % (08/17 0850) Weight:  [144 lb 10 oz (65.6 kg)] 144 lb 10 oz (65.6 kg) (08/17 0445) HEMODYNAMICS:   VENTILATOR SETTINGS: Vent Mode:  [-] PRVC FiO2 (%):  [30 %] 30 % Set Rate:  [14 bmp] 14 bmp Vt Set:  [450 mL] 450 mL PEEP:  [5 cmH20] 5 cmH20 INTAKE / OUTPUT:  Intake/Output Summary (Last 24 hours) at 06/10/15 1031 Last data filed at 06/10/15 1000  Gross per 24 hour  Intake 1867.7 ml  Output   1670 ml  Net  197.7 ml    PHYSICAL EXAMINATION: General:  Moderately agitated on the vent,  Neuro:  RASS: -1 HEENT:  [place]/AT, no lesions Cardiovascular:  S1, S2, no murmurs Lungs:  Coarse upper airway sounds, fine exp wheezes at the bases Abdomen:  Soft, nt/nd Musculoskeletal:  Warm, FROM Skin:  Warm, superficial ulcers on the b\l LE.   LABS:  CBC  Recent Labs Lab 06/06/15 0240 06/07/15 0315 06/08/15 0304  WBC 8.8 6.9 8.9  HGB 11.1* 9.3* 11.0*  HCT 35.9 29.3* 34.5*  PLT 114* 85* 78*   Coag's No results for input(s): APTT, INR in the last 168 hours. BMET  Recent Labs Lab 06/07/15 0315 06/08/15 0304 06/09/15 1215  NA 146* 146* 147*  K 4.3 4.1 3.7  CL 116* 118* 117*  CO2 26 24 26   BUN 48* 44* 46*  CREATININE 1.76* 1.57*  1.34*  GLUCOSE 116* 222* 154*   Electrolytes  Recent Labs Lab 06/07/15 0315 06/08/15 0304 06/09/15 1215  CALCIUM 10.6* 10.9* 11.4*  MG  --   --  1.9   Sepsis Markers  Recent Labs Lab 06/05/15 2131 06/05/15 2249 06/06/15 0240  LATICACIDVEN 1.5 1.1 2.3*   ABG  Recent Labs Lab 06/07/15 1054 06/08/15 0509 06/08/15 1600  PHART 7.38 7.42 7.36  PCO2ART 43 38 44  PO2ART 98 145* 105   Liver Enzymes  Recent Labs Lab 06/05/15 2140 06/06/15 0240  AST 16 18  ALT 18 14  ALKPHOS 106 76  BILITOT 1.3* 0.9  ALBUMIN 3.6 2.6*   Cardiac Enzymes  Recent Labs Lab 06/05/15 2140  TROPONINI 0.04*   Glucose  Recent Labs Lab 06/09/15 0356 06/09/15 1626 06/09/15 1950 06/10/15 0001 06/10/15 0433 06/10/15 0736  GLUCAP 160* 128* 133* 137* 138* 109*    Imaging Dg Chest 1 View  06/10/2015   CLINICAL DATA:  Shortness of Breath  EXAM: CHEST  1 VIEW  COMPARISON:  June 09, 2015  FINDINGS: Endotracheal tube tip is 4.6 cm above the carina. Central catheter tip is in the superior cava. Nasogastric tube tip and side port are coiled within a large hiatal hernia, stable. No pneumothorax. There is hazy opacity in the  right base, stable. Suspect a combination of atelectasis and small pleural effusion. There is a calcified granuloma in the left upper lobe near the apex. Lungs elsewhere clear. Heart is upper normal in size, stable. No adenopathy. No bone lesions.  IMPRESSION: Tube and catheter positions as described without pneumothorax. Large hiatal hernia. Nasogastric tube tip and side port within the hernia. Small right effusion with right base atelectasis. Granuloma left upper lobe near the apex. No new opacity. No change in cardiac silhouette.   Electronically Signed   By: Bretta Bang III M.D.   On: 06/10/2015 07:54   Dg Chest Port 1 View  06/09/2015   CLINICAL DATA:  Evaluate central line placement.  EXAM: PORTABLE CHEST - 1 VIEW  COMPARISON:  06/08/2015  FINDINGS: Placement of a  left jugular central line. Catheter tip in the SVC region. Again noted is a nasogastric tube coiled in a large hiatal hernia. Endotracheal tube is 2 cm above the carina. Persistent densities at the right lung base suggestive for volume loss and cannot exclude pleural effusion. Upper lungs are clear. Calcified granuloma in the left upper lung. Heart size is grossly stable.  IMPRESSION: Central line tip in the SVC region.  Negative for pneumothorax.  Support apparatuses as described.  Persistent basilar densities, particularly on the right side.  Large hiatal hernia with a nasogastric tube.   Electronically Signed   By: Richarda Overlie M.D.   On: 06/09/2015 16:10     ASSESSMENT / PLAN: 81 year old female past medical history of stage III COPD admitted for severe altered mental status/somnolence/cellulitis, and respiratory failure, intubated in the ED and transferred to the ICU.  PULMONARY  A: Acute on chronic hypercapnic respiratory failure AE COPD CO2 narcosis P:  Continue with mechanical ventilation, wean as tolerated Spontaneous breathing trials daily and as tolerated-will plan for trial of extubation at some point in time with intent not to re-intubate Maintain O2 saturations greater than 88% Continue with nebulizers, steroids, broncho- dilators  CARDIOVASCULAR CVL-none A: Hypotension P:  Monitor sedation and analgesia Maintain SBP greater than 90 Maintain map greater than 65 Hold on any antihypertensives medication DVT Prophylaxis  RENAL A: Acute kidney injury - slightly worst today Hypernatremia - improving P:  Most likely related to hypotension Continue with gentle IV fluids, and gentle fluid boluses Monitor electrolytes ICU electrolyte replacement protocol  GASTROINTESTINAL P:  Continue with GI prophylaxis Cont with TF   INFECTIOUS A: Bilateral lower extremity cellulitis P:  BCx2 - NTD UC  Sputum - MRSA 8/13 Abx: Vanc, start date 8/12  MRSA wound  culture  NEUROLOGIC A: Altered mental status/somnolence P:  Secondary to CO2 narcosis Continue with COPD management, monitor ABG, monitor neuro status, RASS 0 to -1 Fentanyl gtts  on the vent   I have personally obtained a history, examined the patient, evaluated laboratory and imaging results, formulated the assessment and plan and placed orders.  The Patient requires high complexity decision making for assessment and support, frequent evaluation and titration of therapies, application of advanced monitoring technologies and extensive interpretation of multiple databases. Critical Care Time devoted to patient care services described in this note is 35 minutes.   Overall, patient is critically ill, prognosis is guarded. Patient at high risk for cardiac arrest and death.   Will obtain Palliative care consult    Lucie Leather, M.D.  [facility]  Medical Director [facility] Medical Director [facility] Cardio-Pulmonary Department

## 2015-06-10 NOTE — Progress Notes (Signed)
Pt was extubated to 28% [place] per MD order. RN and Family at bedside. Pt has DNR order in place.

## 2015-06-10 NOTE — Progress Notes (Signed)
ogt noted to be entangled in hiatal hernia, discontinued by Belia Heman, will re-evaluate

## 2015-06-10 NOTE — Progress Notes (Signed)
[facility] - [place] at [facility]                                                                                                                                                                                            Patient Demographics                                            Patient Demographics   Priscilla Powers, is a 81 y.o. female, DOB - 11/29/1929, ZOX:[id number]  Admit date - 06/05/2015   Admitting Physician Wyatt Haste, MD  Outpatient Primary MD for the patient is No primary care provider on file.   LOS - 5  Subjective:  Asian continued to have fevers yesterday, she started on amiodarone drip for the atrial fibrillation. Review of Systems:   CONSTITUTIONAL: fever Unable to provide any further review of systems due to patient being on the ventilator  Vitals:   Filed Vitals:   06/10/15 1000 06/10/15 1100 06/10/15 1200 06/10/15 1215  BP: 99/58 104/62 93/54   Pulse: 66 52 97   Temp: 100.4 F (38 C) 100.4 F (38 C) 100 F (37.8 C)   TempSrc:      Resp: 16 15 16    Height:      Weight:      SpO2: 100% 100% 100% 100%    Wt Readings from Last 3 Encounters:  06/10/15 65.6 kg (144 lb 10 oz)  05/15/15 61.689 kg (136 lb)  05/12/15 62.143 kg (137 lb)     Intake/Output Summary (Last 24 hours) at 06/10/15 1439 Last data filed at 06/10/15 1000  Gross per 24 hour  Intake 1716.83 ml  Output   1670 ml  Net  46.83 ml    Physical Exam:   GENERAL: Critically ill-appearing female on the ventilator  HEAD, EYES, EARS, NOSE AND THROAT: Atraumatic, normocephalic. . Pupils equal and reactive to light. Sclerae anicteric. No conjunctival injection. No oro-pharyngeal erythema.  NECK: Supple. There is no jugular venous distention. No bruits, no lymphadenopathy, no thyromegaly.  HEART: Regular rate and rhythm,. No murmurs, no rubs, no clicks.  LUNGS: Clear to auscultation bilaterally. No rales or rhonchi. No wheezes.  ABDOMEN: Soft, flat, nontender,  nondistended. Has good bowel sounds. No hepatosplenomegaly appreciated.  EXTREMITIES: No evidence of any cyanosis, clubbing,  1+ l edema.  +2 pedal and radial pulses bilaterally.  NEUROLOGIC: Sedated on the ventilator SKIN: Bruising in the upper extremities Psych: Sedated and on the ventilator LN: No inguinal LN  enlargement    Antibiotics   Anti-infectives    Start     Dose/Rate Route Frequency Ordered Stop   06/07/15 1415  Levofloxacin (LEVAQUIN) IVPB 250 mg  Status:  Discontinued     250 mg 50 mL/hr over 60 Minutes Intravenous Every 24 hours 06/06/15 1412 06/09/15 1032   06/06/15 2300  vancomycin (VANCOCIN) IVPB 750 mg/150 ml premix     750 mg 150 mL/hr over 60 Minutes Intravenous Every 36 hours 06/06/15 0037     06/06/15 1415  levofloxacin (LEVAQUIN) IVPB 500 mg     500 mg 100 mL/hr over 60 Minutes Intravenous  Once 06/06/15 1412 06/06/15 1543   06/06/15 1345  levofloxacin (LEVAQUIN) IVPB 500 mg  Status:  Discontinued     500 mg 100 mL/hr over 60 Minutes Intravenous Every 24 hours 06/06/15 1344 06/06/15 1410   06/06/15 0000  vancomycin (VANCOCIN) IVPB 1000 mg/200 mL premix  Status:  Discontinued     1,000 mg 200 mL/hr over 60 Minutes Intravenous  Once 06/05/15 2357 06/05/15 2357   06/05/15 2330  vancomycin (VANCOCIN) IVPB 1000 mg/200 mL premix     1,000 mg 200 mL/hr over 60 Minutes Intravenous  Once 06/05/15 2316 06/06/15 0133      Medications   Scheduled Meds: . antiseptic oral rinse  7 mL Mouth Rinse q12n4p  . budesonide (PULMICORT) nebulizer solution  0.5 mg Nebulization BID  . chlorhexidine  15 mL Mouth Rinse BID  . Chlorhexidine Gluconate Cloth  6 each Topical Q0600  . enoxaparin (LOVENOX) injection  30 mg Subcutaneous Q24H  . famotidine (PEPCID) IV  20 mg Intravenous Q24H  . furosemide  20 mg Intravenous Q12H  . ipratropium-albuterol  3 mL Nebulization Q6H  . methylPREDNISolone (SOLU-MEDROL) injection  20 mg Intravenous Daily  . mupirocin ointment  1 application  Nasal BID  . sodium chloride  1,000 mL Intravenous Once  . vancomycin  750 mg Intravenous Q36H   Continuous Infusions: . amiodarone 30 mg/hr (06/10/15 1000)  . dextrose 5 % and 0.45% NaCl 75 mL/hr at 06/10/15 1218  . fentaNYL infusion INTRAVENOUS 10 mcg/hr (06/10/15 1023)  . norepinephrine     PRN Meds:.acetaminophen **OR** acetaminophen, fentaNYL (SUBLIMAZE) injection, ibuprofen, LORazepam, midazolam, morphine injection, norepinephrine, ondansetron **OR** ondansetron (ZOFRAN) IV   Data Review:   Micro Results Recent Results (from the past 240 hour(s))  MRSA PCR Screening     Status: Abnormal   Collection Time: 06/05/15  8:16 PM  Result Value Ref Range Status   MRSA by PCR POSITIVE (A) NEGATIVE Final    Comment:        The GeneXpert MRSA Assay (FDA approved for NASAL specimens only), is one component of a comprehensive MRSA colonization surveillance program. It is not intended to diagnose MRSA infection nor to guide or monitor treatment for MRSA infections. CRITICAL RESULT CALLED TO, READ BACK BY AND VERIFIED WITH: CHRISTY AUSTIN AT 0443 06/06/15 SDR   Wound culture     Status: None   Collection Time: 06/05/15  8:54 PM  Result Value Ref Range Status   Specimen Description ABSCESS  Final   Special Requests Normal  Final   Gram Stain RARE WBC SEEN FEW GRAM POSITIVE COCCI IN PAIRS   Final   Culture   Final    HEAVY GROWTH METHICILLIN RESISTANT STAPHYLOCOCCUS AUREUS CRITICAL RESULT CALLED TO, READ BACK BY AND VERIFIED WITH: ADAM SCARBOROUGH AT 1126 06/07/15 DV    Report Status 06/09/2015 FINAL  Final   Organism ID,  Bacteria METHICILLIN RESISTANT STAPHYLOCOCCUS AUREUS  Final      Susceptibility   Methicillin resistant staphylococcus aureus - MIC*    CIPROFLOXACIN >=8 RESISTANT Resistant     GENTAMICIN <=0.5 SENSITIVE Sensitive     OXACILLIN >=4 RESISTANT Resistant     VANCOMYCIN <=0.5 SENSITIVE Sensitive     TRIMETH/SULFA <=10 SENSITIVE Sensitive     CEFOXITIN SCREEN  Value in next row Resistant      POSITIVECEFOXITIN SCREEN - This test may be used to predict mecA-mediated oxacillin resistance, and it is based on the cefoxitin disk screen test.  The cefoxitin screen and oxacillin work in combination to determine the final interpretation reported for oxacillin.     Inducible Clindamycin Value in next row Resistant      POSITIVEINDUCIBLE CLINDAMYCIN RESISTANCE - A positive ICR test is indicative of inducible resistance to macrolides, lincosamides, and type B streptogramin.  This isolate is presumed to be resistant to Clindamycin, however, Clindamycin may still be effective in some patients.     TETRACYCLINE Value in next row Sensitive      SENSITIVE<=1    * HEAVY GROWTH METHICILLIN RESISTANT STAPHYLOCOCCUS AUREUS  Blood culture (routine x 2)     Status: None   Collection Time: 06/05/15  9:27 PM  Result Value Ref Range Status   Specimen Description BLOOD  Final   Special Requests BOTTLES DRAWN AEROBIC AND ANAEROBIC 4CC  Final   Culture NO GROWTH 5 DAYS  Final   Report Status 06/10/2015 FINAL  Final  Blood culture (routine x 2)     Status: None   Collection Time: 06/05/15 10:49 PM  Result Value Ref Range Status   Specimen Description BLOOD  Final   Special Requests BOTTLES DRAWN AEROBIC AND ANAEROBIC 2CC  Final   Culture  Setup Time   Final    GRAM POSITIVE COCCI AEROBIC BOTTLE ONLY CRITICAL RESULT CALLED TO, READ BACK BY AND VERIFIED WITH: ADAM SCARBOROUGH AT 1244 06/07/15 DV    Culture   Final    COAGULASE NEGATIVE STAPHYLOCOCCUS AEROBIC BOTTLE ONLY Results consistent with contamination.    Report Status 06/10/2015 FINAL  Final  Culture, respiratory (NON-Expectorated)     Status: None   Collection Time: 06/06/15 12:40 PM  Result Value Ref Range Status   Specimen Description TRACHEAL ASPIRATE  Final   Special Requests NONE  Final   Gram Stain   Final    MODERATE WBC SEEN FEW GRAM NEGATIVE RODS GOOD SPECIMEN - 80-90% WBCS    Culture   Final     LIGHT GROWTH METHICILLIN RESISTANT STAPHYLOCOCCUS AUREUS CRITICAL VALUE NOTED.  VALUE IS CONSISTENT WITH PREVIOUSLY REPORTED AND CALLED VALUE.    Report Status 06/09/2015 FINAL  Final   Organism ID, Bacteria METHICILLIN RESISTANT STAPHYLOCOCCUS AUREUS  Final      Susceptibility   Methicillin resistant staphylococcus aureus - MIC*    CIPROFLOXACIN >=8 RESISTANT Resistant     GENTAMICIN <=0.5 SENSITIVE Sensitive     OXACILLIN 1 RESISTANT Resistant     VANCOMYCIN 1 SENSITIVE Sensitive     TRIMETH/SULFA <=10 SENSITIVE Sensitive     CEFOXITIN SCREEN Value in next row Resistant      POSITIVECEFOXITIN SCREEN - This test may be used to predict mecA-mediated oxacillin resistance, and it is based on the cefoxitin disk screen test.  The cefoxitin screen and oxacillin work in combination to determine the final interpretation reported for oxacillin.     Inducible Clindamycin Value in next row Resistant  POSITIVEINDUCIBLE CLINDAMYCIN RESISTANCE - A positive ICR test is indicative of inducible resistance to macrolides, lincosamides, and type B streptogramin.  This isolate is presumed to be resistant to Clindamycin, however, Clindamycin may still be effective in some patients.     TETRACYCLINE Value in next row Sensitive      SENSITIVE<=1    * LIGHT GROWTH METHICILLIN RESISTANT STAPHYLOCOCCUS AUREUS  Culture, blood (routine x 2)     Status: None (Preliminary result)   Collection Time: 06/09/15 12:00 PM  Result Value Ref Range Status   Specimen Description BLOOD LEFT ASSIST CONTROL  Final   Special Requests   Final    BOTTLES DRAWN AEROBIC AND ANAEROBIC 6CC AEORBIC,6CC ANAEROBIC   Culture NO GROWTH < 24 HOURS  Final   Report Status PENDING  Incomplete  Culture, blood (routine x 2)     Status: None (Preliminary result)   Collection Time: 06/09/15 12:15 PM  Result Value Ref Range Status   Specimen Description BLOOD RIGHT HAND  Final   Special Requests   Final    BOTTLES DRAWN AEROBIC AND ANAEROBIC  3CC AEROBIC,3CC ANAEROBIC   Culture NO GROWTH < 24 HOURS  Final   Report Status PENDING  Incomplete    Radiology Reports Dg Chest 1 View  06/10/2015   CLINICAL DATA:  Shortness of Breath  EXAM: CHEST  1 VIEW  COMPARISON:  June 09, 2015  FINDINGS: Endotracheal tube tip is 4.6 cm above the carina. Central catheter tip is in the superior cava. Nasogastric tube tip and side port are coiled within a large hiatal hernia, stable. No pneumothorax. There is hazy opacity in the right base, stable. Suspect a combination of atelectasis and small pleural effusion. There is a calcified granuloma in the left upper lobe near the apex. Lungs elsewhere clear. Heart is upper normal in size, stable. No adenopathy. No bone lesions.  IMPRESSION: Tube and catheter positions as described without pneumothorax. Large hiatal hernia. Nasogastric tube tip and side port within the hernia. Small right effusion with right base atelectasis. Granuloma left upper lobe near the apex. No new opacity. No change in cardiac silhouette.   Electronically Signed   By: Bretta Bang III M.D.   On: 06/10/2015 07:54   Dg Chest 1 View  06/05/2015   CLINICAL DATA:  Endotracheal tube placement.  Initial encounter.  EXAM: CHEST  1 VIEW  COMPARISON:  Chest radiograph performed earlier today at 8:45 p.m.  FINDINGS: The patient's endotracheal tube is seen ending 4 cm above the carina. An enteric tube is noted extending below the diaphragm.  The lungs appear clear bilaterally. No focal consolidation, pleural effusion or pneumothorax is seen. Mild scarring is suggested at the right lung apex, though this may reflect overlying tubing.  The cardiomediastinal silhouette is borderline enlarged. No acute osseous abnormalities are identified. A large paraesophageal hernia is again noted.  IMPRESSION: 1. Endotracheal tube seen ending 4 cm above the carina. 2. Lungs clear bilaterally. 3. Borderline cardiomegaly. 4. Large paraesophageal hernia again noted.    Electronically Signed   By: Roanna Raider M.D.   On: 06/05/2015 23:57   Dg Chest 1 View  06/05/2015   CLINICAL DATA:  Altered mental status. Weakness. Shortness of breath.  EXAM: CHEST  1 VIEW  COMPARISON:  One-view chest x-ray 02/22/2014.  FINDINGS: Cardiomegaly is again noted. Lung volumes are low. This is a reversed lordotic view. No focal airspace disease is evident. The visualized soft tissues and bony thorax are unremarkable.  IMPRESSION: 1.  Stable cardiomegaly without failure.   Electronically Signed   By: Marin Roberts M.D.   On: 06/05/2015 21:22   Dg Lumbar Spine Complete  05/13/2015   CLINICAL DATA:  Acute onset of lower back pain.  Initial encounter.  EXAM: LUMBAR SPINE - COMPLETE 4+ VIEW  COMPARISON:  Lumbar spine radiographs and CT performed 03/03/2010, and lumbar spine MRI performed 03/04/2010  FINDINGS: There is no evidence of acute fracture or subluxation. There appears to be significant chronic compression deformity of vertebral body L1, worsened from prior studies. Vertebral bodies demonstrate normal alignment. Mild disc space narrowing is noted at L5-S1.  The visualized bowel gas pattern is unremarkable in appearance; air and stool are noted within the colon. The sacroiliac joints are within normal limits. Scattered vascular calcifications are seen.  IMPRESSION: 1. No evidence of acute fracture or subluxation along the lumbar spine. 2. Significant chronic compression deformity of vertebral body L1, worsened from prior studies.   Electronically Signed   By: Roanna Raider M.D.   On: 05/13/2015 02:23   Ct Head Wo Contrast  06/06/2015   CLINICAL DATA:  Acute onset of altered mental status. High blood pressure. Initial encounter.  EXAM: CT HEAD WITHOUT CONTRAST  TECHNIQUE: Contiguous axial images were obtained from the base of the skull through the vertex without intravenous contrast.  COMPARISON:  None.  FINDINGS: There is no evidence of acute infarction, mass lesion, or intra- or  extra-axial hemorrhage on CT.  Prominence of the ventricles and sulci likely reflects mild cortical volume loss. Scattered periventricular and subcortical white matter change reflects small vessel ischemic microangiopathy. Mild cerebellar atrophy is noted. Chronic ischemic change is noted at the external capsule bilaterally.  The brainstem and fourth ventricle are within normal limits. The basal ganglia are unremarkable in appearance. The cerebral hemispheres demonstrate grossly normal gray-white differentiation. No mass effect or midline shift is seen.  There is no evidence of fracture; visualized osseous structures are unremarkable in appearance. The visualized portions of the orbits are within normal limits. The paranasal sinuses and mastoid air cells are well-aerated. No significant soft tissue abnormalities are seen.  IMPRESSION: 1. No acute intracranial pathology seen on CT. 2. Mild cortical volume loss and scattered small vessel ischemic microangiopathy.   Electronically Signed   By: Roanna Raider M.D.   On: 06/06/2015 02:15   Dg Chest Port 1 View  06/09/2015   CLINICAL DATA:  Evaluate central line placement.  EXAM: PORTABLE CHEST - 1 VIEW  COMPARISON:  06/08/2015  FINDINGS: Placement of a left jugular central line. Catheter tip in the SVC region. Again noted is a nasogastric tube coiled in a large hiatal hernia. Endotracheal tube is 2 cm above the carina. Persistent densities at the right lung base suggestive for volume loss and cannot exclude pleural effusion. Upper lungs are clear. Calcified granuloma in the left upper lung. Heart size is grossly stable.  IMPRESSION: Central line tip in the SVC region.  Negative for pneumothorax.  Support apparatuses as described.  Persistent basilar densities, particularly on the right side.  Large hiatal hernia with a nasogastric tube.   Electronically Signed   By: Richarda Overlie M.D.   On: 06/09/2015 16:10   Dg Chest Port 1 View  06/08/2015   CLINICAL DATA:  Acute on  chronic respiratory failure.  Sepsis.  EXAM: PORTABLE CHEST - 1 VIEW  COMPARISON:  06/05/2015.  FINDINGS: 0608 hr. The endotracheal tube tip is 2 cm above the carina. Nasogastric tube is looped within  a large hiatal hernia and its tip does not extend below the diaphragm. The heart size and mediastinal contours are stable. There are developing bibasilar airspace opacities and probable bilateral pleural effusions. No edema or pneumothorax identified. The bones appear unchanged.  IMPRESSION: Developing bibasilar airspace opacities and probable bilateral pleural effusions. Large hiatal hernia in which the nasogastric tube is looped.   Electronically Signed   By: Carey Bullocks M.D.   On: 06/08/2015 07:39     CBC  Recent Labs Lab 06/05/15 2026 06/06/15 0240 06/07/15 0315 06/08/15 0304 06/10/15 1018  WBC 14.0* 8.8 6.9 8.9 12.5*  HGB 14.5 11.1* 9.3* 11.0* 10.6*  HCT 46.0 35.9 29.3* 34.5* 33.4*  PLT 181 114* 85* 78* 60*  MCV 94.5 96.2 93.9 94.0 93.9  MCH 29.7 29.9 29.9 29.9 29.8  MCHC 31.4* 31.1* 31.8* 31.8* 31.7*  RDW 18.2* 17.0* 17.7* 17.5* 17.9*  LYMPHSABS 1.2  --   --   --   --   MONOABS 1.0*  --   --   --   --   EOSABS 0.0  --   --   --   --   BASOSABS 0.0  --   --   --   --     Chemistries   Recent Labs Lab 06/05/15 2140 06/06/15 0240 06/07/15 0315 06/08/15 0304 06/09/15 1215 06/10/15 1018  NA 148* 148* 146* 146* 147* 140  K 5.0 4.5 4.3 4.1 3.7 3.7  CL 107 113* 116* 118* 117* 109  CO2 34* 30 26 24 26 28   GLUCOSE 105* 103* 116* 222* 154* 109*  BUN 57* 52* 48* 44* 46* 57*  CREATININE 1.59* 1.45* 1.76* 1.57* 1.34* 1.72*  CALCIUM 13.3* 11.4* 10.6* 10.9* 11.4* 10.8*  MG  --   --   --   --  1.9  --   AST 16 18  --   --   --   --   ALT 18 14  --   --   --   --   ALKPHOS 106 76  --   --   --   --   BILITOT 1.3* 0.9  --   --   --   --    ------------------------------------------------------------------------------------------------------------------ estimated creatinine  clearance is 22.6 mL/min (by C-G formula based on Cr of 1.72). ------------------------------------------------------------------------------------------------------------------ No results for input(s): HGBA1C in the last 72 hours. ------------------------------------------------------------------------------------------------------------------ No results for input(s): CHOL, HDL, LDLCALC, TRIG, CHOLHDL, LDLDIRECT in the last 72 hours. ------------------------------------------------------------------------------------------------------------------ No results for input(s): TSH, T4TOTAL, T3FREE, THYROIDAB in the last 72 hours.  Invalid input(s): FREET3 ------------------------------------------------------------------------------------------------------------------ No results for input(s): VITAMINB12, FOLATE, FERRITIN, TIBC, IRON, RETICCTPCT in the last 72 hours.  Coagulation profile No results for input(s): INR, PROTIME in the last 168 hours.  No results for input(s): DDIMER in the last 72 hours.  Cardiac Enzymes  Recent Labs Lab 06/05/15 2140  TROPONINI 0.04*   ------------------------------------------------------------------------------------------------------------------ Invalid input(s): POCBNP    Assessment & Plan   1. Acute respiratory failure with hypercapnia: This is likely due to combination of acute on chronic COPD exasperation, as well as recent narcotic treatment for her back pain. Continue mechanical ventilation continue nebs and steroids also has MRSA growing from her trach aspirates continue vancomycin 2.Sepsis due to MRSA pneumonia and lower extremity cellulitis IV Vanco.   3. Essential hypertension: We will hold Aldactone,, propranolol given relative hypotension 4. Acute renal failure likely ATN: Labile  5. Acute diastolic CHF on Lasix  CODE STATUSDO NOT RESUSCITATE  Code Status Orders        Start     Ordered   06/06/15 1049  Do not attempt  resuscitation (DNR)   Continuous    Question Answer Comment  In the event of cardiac or respiratory ARREST Do not call a "code blue"   In the event of cardiac or respiratory ARREST Do not perform Intubation, CPR, defibrillation or ACLS   In the event of cardiac or respiratory ARREST Use medication by any route, position, wound care, and other measures to relive pain and suffering. May use oxygen, suction and manual treatment of airway obstruction as needed for comfort.      06/06/15 1048    Advance Directive Documentation        Most Recent Value   Type of Advance Directive  Living will, Healthcare Power of Gerrit Friends [Pt's daughter [name] Honeycutt]   Pre-existing out of facility DNR order (yellow form or pink MOST form)     "MOST" Form in Place?        Consults  pulmonary critical care DVT Prophylaxis  heparin  Lab Results  Component Value Date   PLT 60* 06/10/2015     Time Spent in minutes  80 minutes of critical care time spent, extensive discussion with patient's daughter and family regarding no improvement in her current condition, prognosis is very poor. Discussion regarding extubation if patient fails extubation then she'll be made comfort care. If she does okay then we'll continue the current treatment. Daughter wants other family members to arrive later today prior to extubation.    Auburn Bilberry M.D on 06/10/2015 at 2:39 PM  Between 7am to 6pm - Pager - [phone]  After 6pm go to [email or web address] - password [email or web address] [facility]  [facility] [address] [facility]   Office  [phone]

## 2015-06-10 NOTE — Progress Notes (Signed)
To withdraw care when all family arrives this evening, Kasa and SH Allena Katz have spoken with daughter, orders in place

## 2015-06-10 NOTE — Progress Notes (Signed)
Nutrition Follow-up    INTERVENTION:   Coordination of Care: discussed nutritional poc during ICU rounds with MD Kasa; pt without OG at present, not receiving TF or free water flushes. No plan to further attempt NG/OG tube placement at present. If unable to wean from vent within the next 24 hours or so and unable to pass NG/OG due to large hiatal hernia, recommend attempting tube placement under fluroscopy. Also discussed hypernatremia and hypercalcemia; received orders for D5-1/2 NS at 75 ml/hr, continue to assess  NUTRITION DIAGNOSIS:   Inadequate oral intake related to acute illness as evidenced by NPO status.  GOAL:   Provide needs based on ASPEN/SCCM guidelines   MONITOR:    (Energy Intake, EN, Digestive System, Electrolyte/Renal Profile, Glucose Profile, Pulmonary)   ASSESSMENT:    Pt remains on vent, OG removed this AM after xray showing tube coiled within hiatal hernia; Katie RN attempted to replace OG x 2 with no success  Diet Order:  Diet NPO time specified  EN: TF on hold due to lack of access  Skin:  Reviewed, no issues  Last BM:  8/17   Urine Volume: UOP 2045 mL in past 24 hours  Electrolyte and Renal Profile:  Recent Labs Lab 06/08/15 0304 06/09/15 1215 06/10/15 1018  BUN 44* 46* 57*  CREATININE 1.57* 1.34* 1.72*  NA 146* 147* 140  K 4.1 3.7 3.7  MG  --  1.9  --   Corrected calcium 12.5  Protein Profile:  Recent Labs Lab 06/05/15 2140 06/06/15 0240  ALBUMIN 3.6 2.6*   Glucose Profile:  Recent Labs  06/10/15 0433 06/10/15 0736 06/10/15 1124  GLUCAP 138* 109* 108*   Nutritional Anemia Profile:  CBC Latest Ref Rng 06/10/2015 06/08/2015 06/07/2015  WBC 3.6 - 11.0 K/uL 12.5(H) 8.9 6.9  Hemoglobin 12.0 - 16.0 g/dL 10.6(L) 11.0(L) 9.3(L)  Hematocrit 35.0 - 47.0 % 33.4(L) 34.5(L) 29.3(L)  Platelets 150 - 440 K/uL 60(L) 78(L) 85(L)   Meds: fentanyl, lasix, solumedrol, levophed  Height:   Ht Readings from Last 1 Encounters:  06/05/15   (1.6 m)    Weight:   Wt Readings from Last 1 Encounters:  06/10/15 144 lb 10 oz (65.6 kg)   Filed Weights   06/08/15 0438 06/09/15 0500 06/10/15 0445  Weight: 143 lb 4.8 oz (65 kg) 141 lb 5 oz (64.1 kg) 144 lb 10 oz (65.6 kg)    BMI:  Body mass index is 25.63 kg/(m^2).  Estimated Nutritional Needs:   Kcal:  1438 kcals (ve: 6.6, Tmax: 38)  Protein:  86-114 g(1.5-2.0 g/kg)   Fluid:  1425-1710 mL (25-30 ml/kg)   HIGH Care Level  Romelle Starcher MS, RD, LDN [phone] Pager

## 2015-06-10 NOTE — Progress Notes (Signed)
   06/10/15 1349  Clinical Encounter Type  Visited With Patient and family together  Visit Type Follow-up  Consult/Referral To Chaplain  Spiritual Encounters  Spiritual Needs Emotional  Stress Factors  Patient Stress Factors None identified  Family Stress Factors None identified;Health changes  Chaplain rounded in the unit and offered a compassionate presence to the family. Patient is being extubated around 8pm tonight. Daughter stated that they are believers and know that "she is good cause she will be with her Jesus." Chaplain Sophee Mckimmy A. Colman [place] Ext. [phone]

## 2015-06-11 NOTE — Progress Notes (Signed)
Called report to [place], RN for transfer to room 120

## 2015-06-11 NOTE — Progress Notes (Signed)
Nutrition Brief Note  Chart reviewed. Discussed pt during ICU rounds with Priscilla Powers and MD Kasa. Pt now transitioning to comfort care, being transferred to 1C. NPO. No further nutrition interventions warranted at this time.  Please re-consult if needed.   Romelle Starcher MS, RD, LDN [phone] Pager

## 2015-06-11 NOTE — Plan of Care (Signed)
Problem: Discharge Progression Outcomes Goal: Other Discharge Outcomes/Goals Pt transfered from CCU today. Comfort care pt. Pt turned and mouth swabbed. No signs of pain or discomfort. Fentanyl drip continues. Family at the bedside.

## 2015-06-11 NOTE — Care Management (Signed)
Transferred from ICU to room 120. Comfort measures. Family at the bedside. Spoke with Dr. Imogene Burn

## 2015-06-11 NOTE — Progress Notes (Signed)
[facility] [place] at [facility]                                                                                                                                                                                            Patient Demographics                                            Patient Demographics   Priscilla Powers, is a 81 y.o. female, DOB - 1930-06-05, ZOX:[id number]  Admit date - 06/05/2015   Admitting Physician Wyatt Haste, MD  Outpatient Primary MD for the patient is No primary care provider on file.   LOS - 6  Subjective: The patient is unresponsive, status post extubation last night, on fentanyl drip with comfort care now  Review of Systems:  Unable to obtain.  Vitals:   Filed Vitals:   06/11/15 0700 06/11/15 0800 06/11/15 0900 06/11/15 1000  BP: 145/57 120/59 123/64 125/62  Pulse: 84 78 47 117  Temp: 97 F (36.1 C) 96.8 F (36 C) 97 F (36.1 C) 97.2 F (36.2 C)  TempSrc:      Resp: 11 12 11 7   Height:      Weight:      SpO2: 100% 100% 100% 100%    Wt Readings from Last 3 Encounters:  06/10/15 65.6 kg (144 lb 10 oz)  05/15/15 61.689 kg (136 lb)  05/12/15 62.143 kg (137 lb)     Intake/Output Summary (Last 24 hours) at 06/11/15 1820 Last data filed at 06/11/15 1000  Gross per 24 hour  Intake  157.4 ml  Output    600 ml  Net -442.6 ml    Physical Exam:   GENERAL: Unresponsive.  HEAD, EYES, EARS, NOSE AND THROAT: Atraumatic, normocephalic.  NECK: Supple. There is no jugular venous distention.  HEART: Regular rate and rhythm,. No murmurs, no rubs, no clicks.  LUNGS: Clear to auscultation bilaterally. No rales or rhonchi. No wheezes.  ABDOMEN: Soft, flat, nontender, nondistended. Bowel sounds present.  EXTREMITIES: No evidence of any cyanosis, clubbing,  1+ l edema.  NEUROLOGIC:Unresponsive.  SKIN: Bruising in the upper extremities    Antibiotics   Anti-infectives    Start     Dose/Rate Route Frequency Ordered Stop    06/07/15 1415  Levofloxacin (LEVAQUIN) IVPB 250 mg  Status:  Discontinued     250 mg 50 mL/hr over 60 Minutes Intravenous Every 24 hours 06/06/15 1412 06/09/15 1032   06/06/15 2300  vancomycin (VANCOCIN)  IVPB 750 mg/150 ml premix  Status:  Discontinued     750 mg 150 mL/hr over 60 Minutes Intravenous Every 36 hours 06/06/15 0037 06/10/15 1523   06/06/15 1415  levofloxacin (LEVAQUIN) IVPB 500 mg     500 mg 100 mL/hr over 60 Minutes Intravenous  Once 06/06/15 1412 06/06/15 1543   06/06/15 1345  levofloxacin (LEVAQUIN) IVPB 500 mg  Status:  Discontinued     500 mg 100 mL/hr over 60 Minutes Intravenous Every 24 hours 06/06/15 1344 06/06/15 1410   06/06/15 0000  vancomycin (VANCOCIN) IVPB 1000 mg/200 mL premix  Status:  Discontinued     1,000 mg 200 mL/hr over 60 Minutes Intravenous  Once 06/05/15 2357 06/05/15 2357   06/05/15 2330  vancomycin (VANCOCIN) IVPB 1000 mg/200 mL premix     1,000 mg 200 mL/hr over 60 Minutes Intravenous  Once 06/05/15 2316 06/06/15 0133      Medications   Scheduled Meds: . chlorhexidine  15 mL Mouth Rinse BID   Continuous Infusions: . fentaNYL infusion INTRAVENOUS 10 mcg/hr (06/11/15 0301)   PRN Meds:.acetaminophen **OR** acetaminophen, fentaNYL (SUBLIMAZE) injection, ibuprofen, LORazepam, midazolam, morphine injection, ondansetron **OR** ondansetron (ZOFRAN) IV   Data Review:   Micro Results Recent Results (from the past 240 hour(s))  MRSA PCR Screening     Status: Abnormal   Collection Time: 06/05/15  8:16 PM  Result Value Ref Range Status   MRSA by PCR POSITIVE (A) NEGATIVE Final    Comment:        The GeneXpert MRSA Assay (FDA approved for NASAL specimens only), is one component of a comprehensive MRSA colonization surveillance program. It is not intended to diagnose MRSA infection nor to guide or monitor treatment for MRSA infections. CRITICAL RESULT CALLED TO, READ BACK BY AND VERIFIED WITH: CHRISTY AUSTIN AT 0443 06/06/15 SDR   Wound  culture     Status: None   Collection Time: 06/05/15  8:54 PM  Result Value Ref Range Status   Specimen Description ABSCESS  Final   Special Requests Normal  Final   Gram Stain RARE WBC SEEN FEW GRAM POSITIVE COCCI IN PAIRS   Final   Culture   Final    HEAVY GROWTH METHICILLIN RESISTANT STAPHYLOCOCCUS AUREUS CRITICAL RESULT CALLED TO, READ BACK BY AND VERIFIED WITH: ADAM SCARBOROUGH AT 1126 06/07/15 DV    Report Status 06/09/2015 FINAL  Final   Organism ID, Bacteria METHICILLIN RESISTANT STAPHYLOCOCCUS AUREUS  Final      Susceptibility   Methicillin resistant staphylococcus aureus - MIC*    CIPROFLOXACIN >=8 RESISTANT Resistant     GENTAMICIN <=0.5 SENSITIVE Sensitive     OXACILLIN >=4 RESISTANT Resistant     VANCOMYCIN <=0.5 SENSITIVE Sensitive     TRIMETH/SULFA <=10 SENSITIVE Sensitive     CEFOXITIN SCREEN Value in next row Resistant      POSITIVECEFOXITIN SCREEN - This test may be used to predict mecA-mediated oxacillin resistance, and it is based on the cefoxitin disk screen test.  The cefoxitin screen and oxacillin work in combination to determine the final interpretation reported for oxacillin.     Inducible Clindamycin Value in next row Resistant      POSITIVEINDUCIBLE CLINDAMYCIN RESISTANCE - A positive ICR test is indicative of inducible resistance to macrolides, lincosamides, and type B streptogramin.  This isolate is presumed to be resistant to Clindamycin, however, Clindamycin may still be effective in some patients.     TETRACYCLINE Value in next row Sensitive      SENSITIVE<=1    *  HEAVY GROWTH METHICILLIN RESISTANT STAPHYLOCOCCUS AUREUS  Blood culture (routine x 2)     Status: None   Collection Time: 06/05/15  9:27 PM  Result Value Ref Range Status   Specimen Description BLOOD  Final   Special Requests BOTTLES DRAWN AEROBIC AND ANAEROBIC 4CC  Final   Culture NO GROWTH 5 DAYS  Final   Report Status 06/10/2015 FINAL  Final  Blood culture (routine x 2)     Status: None    Collection Time: 06/05/15 10:49 PM  Result Value Ref Range Status   Specimen Description BLOOD  Final   Special Requests BOTTLES DRAWN AEROBIC AND ANAEROBIC 2CC  Final   Culture  Setup Time   Final    GRAM POSITIVE COCCI AEROBIC BOTTLE ONLY CRITICAL RESULT CALLED TO, READ BACK BY AND VERIFIED WITH: ADAM SCARBOROUGH AT 1244 06/07/15 DV    Culture   Final    COAGULASE NEGATIVE STAPHYLOCOCCUS AEROBIC BOTTLE ONLY Results consistent with contamination.    Report Status 06/10/2015 FINAL  Final  Culture, respiratory (NON-Expectorated)     Status: None   Collection Time: 06/06/15 12:40 PM  Result Value Ref Range Status   Specimen Description TRACHEAL ASPIRATE  Final   Special Requests NONE  Final   Gram Stain   Final    MODERATE WBC SEEN FEW GRAM NEGATIVE RODS GOOD SPECIMEN - 80-90% WBCS    Culture   Final    LIGHT GROWTH METHICILLIN RESISTANT STAPHYLOCOCCUS AUREUS CRITICAL VALUE NOTED.  VALUE IS CONSISTENT WITH PREVIOUSLY REPORTED AND CALLED VALUE.    Report Status 06/09/2015 FINAL  Final   Organism ID, Bacteria METHICILLIN RESISTANT STAPHYLOCOCCUS AUREUS  Final      Susceptibility   Methicillin resistant staphylococcus aureus - MIC*    CIPROFLOXACIN >=8 RESISTANT Resistant     GENTAMICIN <=0.5 SENSITIVE Sensitive     OXACILLIN 1 RESISTANT Resistant     VANCOMYCIN 1 SENSITIVE Sensitive     TRIMETH/SULFA <=10 SENSITIVE Sensitive     CEFOXITIN SCREEN Value in next row Resistant      POSITIVECEFOXITIN SCREEN - This test may be used to predict mecA-mediated oxacillin resistance, and it is based on the cefoxitin disk screen test.  The cefoxitin screen and oxacillin work in combination to determine the final interpretation reported for oxacillin.     Inducible Clindamycin Value in next row Resistant      POSITIVEINDUCIBLE CLINDAMYCIN RESISTANCE - A positive ICR test is indicative of inducible resistance to macrolides, lincosamides, and type B streptogramin.  This isolate is presumed to  be resistant to Clindamycin, however, Clindamycin may still be effective in some patients.     TETRACYCLINE Value in next row Sensitive      SENSITIVE<=1    * LIGHT GROWTH METHICILLIN RESISTANT STAPHYLOCOCCUS AUREUS  Culture, blood (routine x 2)     Status: None (Preliminary result)   Collection Time: 06/09/15 12:00 PM  Result Value Ref Range Status   Specimen Description BLOOD LEFT ASSIST CONTROL  Final   Special Requests   Final    BOTTLES DRAWN AEROBIC AND ANAEROBIC 6CC AEORBIC,6CC ANAEROBIC   Culture NO GROWTH 2 DAYS  Final   Report Status PENDING  Incomplete  Culture, blood (routine x 2)     Status: None (Preliminary result)   Collection Time: 06/09/15 12:15 PM  Result Value Ref Range Status   Specimen Description BLOOD RIGHT HAND  Final   Special Requests   Final    BOTTLES DRAWN AEROBIC AND ANAEROBIC 3CC AEROBIC,3CC  ANAEROBIC   Culture NO GROWTH 2 DAYS  Final   Report Status PENDING  Incomplete    Radiology Reports Dg Chest 1 View  06/10/2015   CLINICAL DATA:  Shortness of Breath  EXAM: CHEST  1 VIEW  COMPARISON:  June 09, 2015  FINDINGS: Endotracheal tube tip is 4.6 cm above the carina. Central catheter tip is in the superior cava. Nasogastric tube tip and side port are coiled within a large hiatal hernia, stable. No pneumothorax. There is hazy opacity in the right base, stable. Suspect a combination of atelectasis and small pleural effusion. There is a calcified granuloma in the left upper lobe near the apex. Lungs elsewhere clear. Heart is upper normal in size, stable. No adenopathy. No bone lesions.  IMPRESSION: Tube and catheter positions as described without pneumothorax. Large hiatal hernia. Nasogastric tube tip and side port within the hernia. Small right effusion with right base atelectasis. Granuloma left upper lobe near the apex. No new opacity. No change in cardiac silhouette.   Electronically Signed   By: Bretta Bang III M.D.   On: 06/10/2015 07:54   Dg Chest 1  View  06/05/2015   CLINICAL DATA:  Endotracheal tube placement.  Initial encounter.  EXAM: CHEST  1 VIEW  COMPARISON:  Chest radiograph performed earlier today at 8:45 p.m.  FINDINGS: The patient's endotracheal tube is seen ending 4 cm above the carina. An enteric tube is noted extending below the diaphragm.  The lungs appear clear bilaterally. No focal consolidation, pleural effusion or pneumothorax is seen. Mild scarring is suggested at the right lung apex, though this may reflect overlying tubing.  The cardiomediastinal silhouette is borderline enlarged. No acute osseous abnormalities are identified. A large paraesophageal hernia is again noted.  IMPRESSION: 1. Endotracheal tube seen ending 4 cm above the carina. 2. Lungs clear bilaterally. 3. Borderline cardiomegaly. 4. Large paraesophageal hernia again noted.   Electronically Signed   By: Roanna Raider M.D.   On: 06/05/2015 23:57   Dg Chest 1 View  06/05/2015   CLINICAL DATA:  Altered mental status. Weakness. Shortness of breath.  EXAM: CHEST  1 VIEW  COMPARISON:  One-view chest x-ray 02/22/2014.  FINDINGS: Cardiomegaly is again noted. Lung volumes are low. This is a reversed lordotic view. No focal airspace disease is evident. The visualized soft tissues and bony thorax are unremarkable.  IMPRESSION: 1. Stable cardiomegaly without failure.   Electronically Signed   By: Marin Roberts M.D.   On: 06/05/2015 21:22   Dg Lumbar Spine Complete  05/13/2015   CLINICAL DATA:  Acute onset of lower back pain.  Initial encounter.  EXAM: LUMBAR SPINE - COMPLETE 4+ VIEW  COMPARISON:  Lumbar spine radiographs and CT performed 03/03/2010, and lumbar spine MRI performed 03/04/2010  FINDINGS: There is no evidence of acute fracture or subluxation. There appears to be significant chronic compression deformity of vertebral body L1, worsened from prior studies. Vertebral bodies demonstrate normal alignment. Mild disc space narrowing is noted at L5-S1.  The visualized  bowel gas pattern is unremarkable in appearance; air and stool are noted within the colon. The sacroiliac joints are within normal limits. Scattered vascular calcifications are seen.  IMPRESSION: 1. No evidence of acute fracture or subluxation along the lumbar spine. 2. Significant chronic compression deformity of vertebral body L1, worsened from prior studies.   Electronically Signed   By: Roanna Raider M.D.   On: 05/13/2015 02:23   Ct Head Wo Contrast  06/06/2015   CLINICAL DATA:  Acute onset of altered mental status. High blood pressure. Initial encounter.  EXAM: CT HEAD WITHOUT CONTRAST  TECHNIQUE: Contiguous axial images were obtained from the base of the skull through the vertex without intravenous contrast.  COMPARISON:  None.  FINDINGS: There is no evidence of acute infarction, mass lesion, or intra- or extra-axial hemorrhage on CT.  Prominence of the ventricles and sulci likely reflects mild cortical volume loss. Scattered periventricular and subcortical white matter change reflects small vessel ischemic microangiopathy. Mild cerebellar atrophy is noted. Chronic ischemic change is noted at the external capsule bilaterally.  The brainstem and fourth ventricle are within normal limits. The basal ganglia are unremarkable in appearance. The cerebral hemispheres demonstrate grossly normal gray-white differentiation. No mass effect or midline shift is seen.  There is no evidence of fracture; visualized osseous structures are unremarkable in appearance. The visualized portions of the orbits are within normal limits. The paranasal sinuses and mastoid air cells are well-aerated. No significant soft tissue abnormalities are seen.  IMPRESSION: 1. No acute intracranial pathology seen on CT. 2. Mild cortical volume loss and scattered small vessel ischemic microangiopathy.   Electronically Signed   By: Roanna Raider M.D.   On: 06/06/2015 02:15   Dg Chest Port 1 View  06/09/2015   CLINICAL DATA:  Evaluate central  line placement.  EXAM: PORTABLE CHEST - 1 VIEW  COMPARISON:  06/08/2015  FINDINGS: Placement of a left jugular central line. Catheter tip in the SVC region. Again noted is a nasogastric tube coiled in a large hiatal hernia. Endotracheal tube is 2 cm above the carina. Persistent densities at the right lung base suggestive for volume loss and cannot exclude pleural effusion. Upper lungs are clear. Calcified granuloma in the left upper lung. Heart size is grossly stable.  IMPRESSION: Central line tip in the SVC region.  Negative for pneumothorax.  Support apparatuses as described.  Persistent basilar densities, particularly on the right side.  Large hiatal hernia with a nasogastric tube.   Electronically Signed   By: Richarda Overlie M.D.   On: 06/09/2015 16:10   Dg Chest Port 1 View  06/08/2015   CLINICAL DATA:  Acute on chronic respiratory failure.  Sepsis.  EXAM: PORTABLE CHEST - 1 VIEW  COMPARISON:  06/05/2015.  FINDINGS: 0608 hr. The endotracheal tube tip is 2 cm above the carina. Nasogastric tube is looped within a large hiatal hernia and its tip does not extend below the diaphragm. The heart size and mediastinal contours are stable. There are developing bibasilar airspace opacities and probable bilateral pleural effusions. No edema or pneumothorax identified. The bones appear unchanged.  IMPRESSION: Developing bibasilar airspace opacities and probable bilateral pleural effusions. Large hiatal hernia in which the nasogastric tube is looped.   Electronically Signed   By: Carey Bullocks M.D.   On: 06/08/2015 07:39     CBC  Recent Labs Lab 06/05/15 2026 06/06/15 0240 06/07/15 0315 06/08/15 0304 06/10/15 1018  WBC 14.0* 8.8 6.9 8.9 12.5*  HGB 14.5 11.1* 9.3* 11.0* 10.6*  HCT 46.0 35.9 29.3* 34.5* 33.4*  PLT 181 114* 85* 78* 60*  MCV 94.5 96.2 93.9 94.0 93.9  MCH 29.7 29.9 29.9 29.9 29.8  MCHC 31.4* 31.1* 31.8* 31.8* 31.7*  RDW 18.2* 17.0* 17.7* 17.5* 17.9*  LYMPHSABS 1.2  --   --   --   --    MONOABS 1.0*  --   --   --   --   EOSABS 0.0  --   --   --   --  BASOSABS 0.0  --   --   --   --     Chemistries   Recent Labs Lab 06/05/15 2140 06/06/15 0240 06/07/15 0315 06/08/15 0304 06/09/15 1215 06/10/15 1018  NA 148* 148* 146* 146* 147* 140  K 5.0 4.5 4.3 4.1 3.7 3.7  CL 107 113* 116* 118* 117* 109  CO2 34* 30 26 24 26 28   GLUCOSE 105* 103* 116* 222* 154* 109*  BUN 57* 52* 48* 44* 46* 57*  CREATININE 1.59* 1.45* 1.76* 1.57* 1.34* 1.72*  CALCIUM 13.3* 11.4* 10.6* 10.9* 11.4* 10.8*  MG  --   --   --   --  1.9  --   AST 16 18  --   --   --   --   ALT 18 14  --   --   --   --   ALKPHOS 106 76  --   --   --   --   BILITOT 1.3* 0.9  --   --   --   --    ------------------------------------------------------------------------------------------------------------------ estimated creatinine clearance is 22.6 mL/min (by C-G formula based on Cr of 1.72). ------------------------------------------------------------------------------------------------------------------ No results for input(s): HGBA1C in the last 72 hours. ------------------------------------------------------------------------------------------------------------------ No results for input(s): CHOL, HDL, LDLCALC, TRIG, CHOLHDL, LDLDIRECT in the last 72 hours. ------------------------------------------------------------------------------------------------------------------ No results for input(s): TSH, T4TOTAL, T3FREE, THYROIDAB in the last 72 hours.  Invalid input(s): FREET3 ------------------------------------------------------------------------------------------------------------------ No results for input(s): VITAMINB12, FOLATE, FERRITIN, TIBC, IRON, RETICCTPCT in the last 72 hours.  Coagulation profile No results for input(s): INR, PROTIME in the last 168 hours.  No results for input(s): DDIMER in the last 72 hours.  Cardiac Enzymes  Recent Labs Lab 06/05/15 2140  TROPONINI 0.04*    ------------------------------------------------------------------------------------------------------------------ Invalid input(s): POCBNP    Assessment & Plan   1. Acute respiratory failure with hypercapnia: This is likely due to combination of acute on chronic COPD exasperation, as well as recent narcotic treatment for her back pain.  2.Sepsis due to MRSA pneumonia and lower extremity cellulitis. She was on vancomycin. 3. Essential hypertension. 4. Acute renal failure likely ATN: Labile  5. Acute diastolic CHF.was on Lasix.   The patient was extubated last night and placed on comfort care. I discussed with the patient's daughters about the patient's condition, very poor prognosis and possible hospice home discharge if patient survives.  I discussed with the case Production designer, theatre/television/film and Child psychotherapist.  CODE STATUSDO NOT RESUSCITATE     Code Status Orders        Start     Ordered   06/06/15 1049  Do not attempt resuscitation (DNR)   Continuous    Question Answer Comment  In the event of cardiac or respiratory ARREST Do not call a "code blue"   In the event of cardiac or respiratory ARREST Do not perform Intubation, CPR, defibrillation or ACLS   In the event of cardiac or respiratory ARREST Use medication by any route, position, wound care, and other measures to relive pain and suffering. May use oxygen, suction and manual treatment of airway obstruction as needed for comfort.      06/06/15 1048    Advance Directive Documentation        Most Recent Value   Type of Advance Directive  Living will, Healthcare Power of Gerrit Friends [Pt's daughter [name] Honeycutt]   Pre-existing out of facility DNR order (yellow form or pink MOST form)     "MOST" Form in Place?  Consults  pulmonary critical care DVT Prophylaxis  heparin  Lab Results  Component Value Date   PLT 60* 06/10/2015     Time Spent in 37 minutes    Shaune Pollack M.D on 06/11/2015 at 6:19 PM  Between 7am to 6pm - Pager  - [phone]  After 6pm go to [email or web address] - password [email or web address] [facility]  [facility]   Office  [phone]

## 2015-06-12 DIAGNOSIS — J9621 Acute and chronic respiratory failure with hypoxia: Secondary | ICD-10-CM | POA: Insufficient documentation

## 2015-06-12 DIAGNOSIS — L03115 Cellulitis of right lower limb: Secondary | ICD-10-CM

## 2015-06-12 DIAGNOSIS — J449 Chronic obstructive pulmonary disease, unspecified: Secondary | ICD-10-CM

## 2015-06-12 DIAGNOSIS — Z515 Encounter for palliative care: Secondary | ICD-10-CM

## 2015-06-12 DIAGNOSIS — L03116 Cellulitis of left lower limb: Secondary | ICD-10-CM

## 2015-06-12 MED ORDER — GLYCOPYRROLATE 0.2 MG/ML IJ SOLN
0.2000 mg | INTRAMUSCULAR | Status: DC | PRN
  Administered 2015-06-12 – 2015-06-13 (×4): 0.2 mg via INTRAVENOUS
  Filled 2015-06-12 (×5): qty 1

## 2015-06-12 NOTE — Consult Note (Addendum)
Palliative Medicine Inpatient Consult Note   Name: Priscilla Powers Date: 06/12/2015 MRN: [id number]  DOB: 19-May-1930  Referring Physician: Shaune Pollack, MD  Palliative Care consult requested for this 81 y.o. female for goals of medical therapy in patient with acute respiratory failure felt to be related to probabl MRSA pneumonia.  Pt is thought to have a COPD exacerbation as well (never smoker however).  She has a MRSA Leg cellulitis and was on Vancomycin.  The pt had to be intubated and once extubated, she was not very responsive. Yesterday and this am, patient's physicians talked with family about comfort care and they have called for a Palliative Care consult to discuss a possible transfer to Hospice Home.   TODAY'S CONVE RSATIONS, EVENTS, AND PLANS: 1)  Pt is DNR and Renette Butters form is placed in the chart.  2)  Pt is to be seen by Hospice Liaison and is expected to go to [facility] when there is an available bed.  Right now, there is a waiting list. 3)  Pt's symptoms seem to be well controlled on orders placed by attending. 4)  Supportive conversations are needed, as relatives in the room seemed to have differing ideas about the patient's status (with the sister focusing on a miracle getting pt 'better' today0.  5)  I have related my conversations to the Hospice Liaison and to Care Mgr.      REVIEW OF SYSTEMS:  Patient is not able to provide ROS  Due to advanced illness and lack of awareness.  SPIRITUAL SUPPORT SYSTEM: Yes  -family.  SOCIAL HISTORY:  reports that she has never smoked. She does not have any smokeless tobacco history on file. She reports that she does not drink alcohol.  Pt was living with daughter, Priscilla Powers.  Priscilla Powers has to wear a mask etc as she has a medical condition and cannot get infected at this time.   Another daughter and a sister are present in the room.  Pt is widowed.    LEGAL DOCUMENTS:  I have placed a [place] DNR form in the hard chart today. I do not see any other  documents such as Living Will or HCPOA  CODE STATUS: DNR  PAST MEDICAL HISTORY: Past Medical History  Diagnosis Date  . COPD (chronic obstructive pulmonary disease)   . CHF (congestive heart failure)   . Hypertension     PAST SURGICAL HISTORY:  Past Surgical History  Procedure Laterality Date  . Abdominal hysterectomy      ALLERGIES:  is allergic to penicillins.  MEDICATIONS:  Current Facility-Administered Medications  Medication Dose Route Frequency Provider Last Rate Last Dose  . acetaminophen (TYLENOL) tablet 650 mg  650 mg Oral Q6H PRN Wyatt Haste, MD   650 mg at 06/10/15 4098   Or  . acetaminophen (TYLENOL) suppository 650 mg  650 mg Rectal Q6H PRN Wyatt Haste, MD   650 mg at 06/10/15 1103  . chlorhexidine (PERIDEX) 0.12 % solution 15 mL  15 mL Mouth Rinse BID Vishal Mungal, MD   15 mL at 06/11/15 2330  . fentaNYL (SUBLIMAZE) injection 50 mcg  50 mcg Intravenous Q1H PRN Erin Fulling, MD   50 mcg at 06/10/15 0948  . fentaNYL in NS (50mcg/ml) infusion-PREMIX  10 mcg/hr Intravenous Continuous Shaune Pollack, MD 1 mL/hr at 06/11/15 1950 10 mcg/hr at 06/11/15 1950  . glycopyrrolate (ROBINUL) injection 0.2 mg  0.2 mg Intravenous Q2H PRN Crissie Figures, MD   0.2 mg at 06/12/15  0272  . ibuprofen (ADVIL,MOTRIN) tablet 400 mg  400 mg Oral Q6H PRN Auburn Bilberry, MD   400 mg at 06/09/15 2002  . LORazepam (ATIVAN) injection 1 mg  1 mg Intravenous Q1H PRN Auburn Bilberry, MD   1 mg at 06/10/15 2008  . midazolam (VERSED) injection 2 mg  2 mg Intravenous Q1H PRN Erin Fulling, MD   2 mg at 06/10/15 1056  . morphine 2 MG/ML injection 2 mg  2 mg Intravenous Q15 min PRN Auburn Bilberry, MD      . ondansetron (ZOFRAN) tablet 4 mg  4 mg Oral Q6H PRN Wyatt Haste, MD       Or  . ondansetron [facility]) injection 4 mg  4 mg Intravenous Q6H PRN Wyatt Haste, MD        Vital Signs: BP 125/62 mmHg  Pulse 117  Temp(Src) 97.2 F (36.2 C) (Other (Comment))  Resp 7  Ht 5\' 3"  (1.6 m)   Wt 65.6 kg (144 lb 10 oz)  BMI 25.63 kg/m2  SpO2 100% Filed Weights   06/08/15 0438 06/09/15 0500 06/10/15 0445  Weight: 65 kg (143 lb 4.8 oz) 64.1 kg (141 lb 5 oz) 65.6 kg (144 lb 10 oz)    Estimated body mass index is 25.63 kg/(m^2) as calculated from the following:   Height as of this encounter: 5\' 3"  (1.6 m).   Weight as of this encounter: 65.6 kg (144 lb 10 oz).  PERFORMANCE STATUS (ECOG) : 4 - Bedbound  PHYSICAL EXAM: Elderly, cachectic woman with mouth wide open and red cheeks and a towel behind her neck to keep her head from being hyperextended. Pupils are equal and reactive sluggishly Nares patent OP required suctioning by me due to saliva causing coughing Neck w/o JVD Hrt irreg irreg Lungs with ronchi, no wheezing Abd soft scant BS Ext with redness --no mottling  LABS: CBC:    Component Value Date/Time   WBC 12.5* 06/10/2015 1018   WBC 9.8 02/23/2014 0545   HGB 10.6* 06/10/2015 1018   HGB 12.7 02/23/2014 0545   HCT 33.4* 06/10/2015 1018   HCT 42.0 02/23/2014 0545   PLT 60* 06/10/2015 1018   PLT 126* 02/23/2014 0545   MCV 93.9 06/10/2015 1018   MCV 88 02/23/2014 0545   NEUTROABS 11.9* 06/05/2015 2026   NEUTROABS 8.6* 02/23/2014 0545   LYMPHSABS 1.2 06/05/2015 2026   LYMPHSABS 0.6* 02/23/2014 0545   MONOABS 1.0* 06/05/2015 2026   MONOABS 0.6 02/23/2014 0545   EOSABS 0.0 06/05/2015 2026   EOSABS 0.0 02/23/2014 0545   BASOSABS 0.0 06/05/2015 2026   BASOSABS 0.0 02/23/2014 0545   Comprehensive Metabolic Panel:    Component Value Date/Time   NA 140 06/10/2015 1018   NA 139 02/24/2014 0502   K 3.7 06/10/2015 1018   K 4.4 02/24/2014 0502   CL 109 06/10/2015 1018   CL 93* 02/24/2014 0502   CO2 28 06/10/2015 1018   CO2 43* 02/24/2014 0502   BUN 57* 06/10/2015 1018   BUN 43* 02/24/2014 0502   CREATININE 1.72* 06/10/2015 1018   CREATININE 1.26 02/24/2014 0502   GLUCOSE 109* 06/10/2015 1018   GLUCOSE 122* 02/24/2014 0502   CALCIUM 10.8* 06/10/2015 1018    CALCIUM 9.5 02/24/2014 0502   AST 18 06/06/2015 0240   AST 70* 02/24/2014 0502   ALT 14 06/06/2015 0240   ALT 362* 02/24/2014 0502   ALKPHOS 76 06/06/2015 0240   ALKPHOS 81 02/24/2014 0502   BILITOT 0.9 06/06/2015  0240   BILITOT 0.8 02/24/2014 0502   PROT 4.8* 06/06/2015 0240   PROT 5.4* 02/24/2014 0502   ALBUMIN 2.6* 06/06/2015 0240   ALBUMIN 3.0* 02/24/2014 0502    IMPRESSION: 1) Acute on chronic respiratory failure with hypercapnia ---she has had altered awareness and metabolic encephalopathy related to CO2 narcosis ---was previously on the vent and was extubated in pm of 06/10/15.  ---not very responsive post-extubation and has been made comfort care following discussions by Dr. Belia Heman and Dr. Eliane Decree with family.  ---Currently on Fentanyl drip and getting Robinul for oral secretion management 2) COPD exacerbation 3)  MRSA pneumonia 4)  MRSA bilateral lower leg cellulitis (was on Vancomycin) 5)  Acute renal failure, likely ATN 6)  Acute Diastolic CHF (was on lasix) EF was 65% on echo done 06/09/15.  7)  Atrial fibrillation --was on amiodarone 8)  HTN  9)  Anemia 10) Severe Malnutrition with cachexia and muscle atrophy and low albumin 11) Dysphagia  ---she is not managing her oral secretions well and is requiring Robinul to help dry them (she coughs and gags with saliva running into her airway). She is NPO status.   See top of note for plan  REFERRALS TO BE ORDERED:  [facility] transfer --when a bed is available.   More than 50% of the visit was spent in counseling/coordination of care: Yes  Time Spent: 60 minutes  ADDENDUM  I will sign off at this time. If, for some reason, she is still here on Monday, I can become involved again if needed.  There is no palliative care coverage over the weekend, but Hospice is available any time and will be communicating about when a bed becomes available.

## 2015-06-12 NOTE — Progress Notes (Addendum)
Pt lethargic, alert to person and place. Denies pain. No signs of discomfort. Continues Fentanyl drip. Robinul given once for secretions with improvement. Oral care performed, repositioned pt. Awaiting for a bed in hospice home. Discharge order in place. Family at the bedside.

## 2015-06-12 NOTE — Progress Notes (Signed)
Speech Therapy Note: received order, reviewed chart notes and consulted NSG re: pt's status. Pt has been made comfort care at this time and is awaiting transfer to Hospice Home for end of life. Pt is not fully alert/awake and is requiring oral suctioning d/t increased secretions(meds are being given to manage secretions per NSG/chart). Pt appears at increased, high risk for aspiration w/ potential po's, as well as to her own saliva and secretions, and would not be appropriate for BSE at this time. NSG consulted and agreed; Hospice Liaison agreed. Rec.'d oral care for comfort when pt is awake; aspiration precautions. ST can be reconsulted if any change in pt's status indicating need for BSE. NSG agreed.

## 2015-06-12 NOTE — Progress Notes (Signed)
Pt with an abundance of oral secretions. Oral suctioned frequently. Family member at bedside afraid and anxious that patient is choking. Secretions are clear and thin. Pt on comfort measures only. Notified Dr Betti Cruz via telephone. Order received via telephone read back process for Robinul. Will continue to monitor.Priscilla [place]in[place], RN

## 2015-06-12 NOTE — Plan of Care (Signed)
Problem: Discharge Progression Outcomes Goal: Other Discharge Outcomes/Goals Outcome: Progressing Assumed care of patient at 2300. Pt was a tx from CCU yesterday. Responds to voice but very HOH. Niece at bedside with loads of anxiety. To help with her anxiety further explanations were given about what to expect during the dying process. Literature provided.  Pt with an abundance of oral secretions initially with frequent oral suctioning. MD notified and Robinul was ordered IV. Robinul has definitely lessened the times that patient needs to be suctioned. Fentanyl drip infusing and patient seems to be comfortable. Turned and repositioned per family request. Frequent oral care provided.

## 2015-06-12 NOTE — Care Management (Signed)
Dr. Orvan Falconer to discuss Hospice Home option with family members. Spoke with Renea Ee RN representative for [facility] [address]. States that there are no available beds at the [facility] today. Will be placed on waiting room list. Gwenette Greet RN MSN Care Management [phone]

## 2015-06-12 NOTE — Progress Notes (Signed)
New referral for hospice home post palliative medicine consult on 81yo female who was admitted to [facility] on 8.12.16 with sepsis, CHR and respiratory failure.  Upon admission, patient was intubated and found to have MRSA pneumonia.  Post extubation on 8.17.16, patient remained unresponsive and family decided to make her comfort care.  Patient has past medical history of COPD, CHF and hypertension.  Patient has MRSA cellulitis to bilateral lower legs that was being treated with vancomycin. Other factors contributing to her decline is Acute Diastolic CHF EF 16%, anemia, severe malnutrition with cachexia and muscle atrophy and dysphagia.  I met with Manuela Schwartz Honeycutt/daughter/HCPOA and Butch Penny Connerley/daughter to discuss hospice home services.  Both are in agreement with and request transition to the hospice home for end of life care.  They are aware that currently the hospice home has a wait list and a bed will be offered when one is available.  Consents signed and updated information faxed to referral intake.  Staff at [facility] also aware that patient is being placed on wait list.  Hospice staff will continue to follow through final disposition.  Thank you for allowing participation in this patient's care.  Dimas Aguas RN Clinical Nurse Liaison [facility] [place] [phone]

## 2015-06-12 NOTE — Care Management Important Message (Signed)
Important Message  Patient Details  Name: [name] MRN: [id number] Date of Birth: [birth year]   Medicare Important Message Given:  Yes-third notification given    Gwenette Greet, RN 06/12/2015, 12:22 PM

## 2015-06-12 NOTE — Progress Notes (Signed)
[facility] - [place] at [facility]                                                                                                                                                                                            Patient Demographics                                            Patient Demographics   Priscilla Powers, is a 81 y.o. female, DOB - 1930-05-08, ZOX:[id number]  Admit date - 06/05/2015   Admitting Physician Wyatt Haste, MD  Outpatient Primary MD for the patient is No primary care provider on file.   LOS - 7  Subjective: The patient is responsive to calling her name, status post extubation 2 days ago, on fentanyl drip with comfort care now  Review of Systems:  Unable to obtain.  Vitals:   Filed Vitals:   06/11/15 0700 06/11/15 0800 06/11/15 0900 06/11/15 1000  BP: 145/57 120/59 123/64 125/62  Pulse: 84 78 47 117  Temp: 97 F (36.1 C) 96.8 F (36 C) 97 F (36.1 C) 97.2 F (36.2 C)  TempSrc:      Resp: 11 12 11 7   Height:      Weight:      SpO2: 100% 100% 100% 100%    Wt Readings from Last 3 Encounters:  06/10/15 65.6 kg (144 lb 10 oz)  05/15/15 61.689 kg (136 lb)  05/12/15 62.143 kg (137 lb)     Intake/Output Summary (Last 24 hours) at 06/12/15 1451 Last data filed at 06/12/15 0109  Gross per 24 hour  Intake      0 ml  Output   1025 ml  Net  -1025 ml    Physical Exam:   GENERAL: Unresponsive.  HEAD, EYES, EARS, NOSE AND THROAT: Atraumatic, normocephalic.  NECK: Supple. There is no jugular venous distention.  HEART: Regular rate and rhythm,. No murmurs, no rubs, no clicks.  LUNGS: Clear to auscultation bilaterally. No rales or rhonchi. No wheezes.  ABDOMEN: Soft, flat, nontender, nondistended. Bowel sounds present.  EXTREMITIES: No evidence of any cyanosis, clubbing,  1+ l edema.  NEUROLOGIC: Minimal responsive.  SKIN: Bruising in the upper extremities    Antibiotics   Anti-infectives    Start     Dose/Rate Route  Frequency Ordered Stop   06/07/15 1415  Levofloxacin (LEVAQUIN) IVPB 250 mg  Status:  Discontinued     250 mg 50 mL/hr over 60 Minutes Intravenous Every 24 hours  06/06/15 1412 06/09/15 1032   06/06/15 2300  vancomycin (VANCOCIN) IVPB 750 mg/150 ml premix  Status:  Discontinued     750 mg 150 mL/hr over 60 Minutes Intravenous Every 36 hours 06/06/15 0037 06/10/15 1523   06/06/15 1415  levofloxacin (LEVAQUIN) IVPB 500 mg     500 mg 100 mL/hr over 60 Minutes Intravenous  Once 06/06/15 1412 06/06/15 1543   06/06/15 1345  levofloxacin (LEVAQUIN) IVPB 500 mg  Status:  Discontinued     500 mg 100 mL/hr over 60 Minutes Intravenous Every 24 hours 06/06/15 1344 06/06/15 1410   06/06/15 0000  vancomycin (VANCOCIN) IVPB 1000 mg/200 mL premix  Status:  Discontinued     1,000 mg 200 mL/hr over 60 Minutes Intravenous  Once 06/05/15 2357 06/05/15 2357   06/05/15 2330  vancomycin (VANCOCIN) IVPB 1000 mg/200 mL premix     1,000 mg 200 mL/hr over 60 Minutes Intravenous  Once 06/05/15 2316 06/06/15 0133      Medications   Scheduled Meds: . chlorhexidine  15 mL Mouth Rinse BID   Continuous Infusions: . fentaNYL infusion INTRAVENOUS 10 mcg/hr (06/11/15 1950)   PRN Meds:.acetaminophen **OR** acetaminophen, fentaNYL (SUBLIMAZE) injection, glycopyrrolate, ibuprofen, LORazepam, midazolam, morphine injection, ondansetron **OR** ondansetron (ZOFRAN) IV   Data Review:   Micro Results Recent Results (from the past 240 hour(s))  MRSA PCR Screening     Status: Abnormal   Collection Time: 06/05/15  8:16 PM  Result Value Ref Range Status   MRSA by PCR POSITIVE (A) NEGATIVE Final    Comment:        The GeneXpert MRSA Assay (FDA approved for NASAL specimens only), is one component of a comprehensive MRSA colonization surveillance program. It is not intended to diagnose MRSA infection nor to guide or monitor treatment for MRSA infections. CRITICAL RESULT CALLED TO, READ BACK BY AND VERIFIED  WITH: CHRISTY AUSTIN AT 0443 06/06/15 SDR   Wound culture     Status: None   Collection Time: 06/05/15  8:54 PM  Result Value Ref Range Status   Specimen Description ABSCESS  Final   Special Requests Normal  Final   Gram Stain RARE WBC SEEN FEW GRAM POSITIVE COCCI IN PAIRS   Final   Culture   Final    HEAVY GROWTH METHICILLIN RESISTANT STAPHYLOCOCCUS AUREUS CRITICAL RESULT CALLED TO, READ BACK BY AND VERIFIED WITH: ADAM SCARBOROUGH AT 1126 06/07/15 DV    Report Status 06/09/2015 FINAL  Final   Organism ID, Bacteria METHICILLIN RESISTANT STAPHYLOCOCCUS AUREUS  Final      Susceptibility   Methicillin resistant staphylococcus aureus - MIC*    CIPROFLOXACIN >=8 RESISTANT Resistant     GENTAMICIN <=0.5 SENSITIVE Sensitive     OXACILLIN >=4 RESISTANT Resistant     VANCOMYCIN <=0.5 SENSITIVE Sensitive     TRIMETH/SULFA <=10 SENSITIVE Sensitive     CEFOXITIN SCREEN Value in next row Resistant      POSITIVECEFOXITIN SCREEN - This test may be used to predict mecA-mediated oxacillin resistance, and it is based on the cefoxitin disk screen test.  The cefoxitin screen and oxacillin work in combination to determine the final interpretation reported for oxacillin.     Inducible Clindamycin Value in next row Resistant      POSITIVEINDUCIBLE CLINDAMYCIN RESISTANCE - A positive ICR test is indicative of inducible resistance to macrolides, lincosamides, and type B streptogramin.  This isolate is presumed to be resistant to Clindamycin, however, Clindamycin may still be effective in some patients.  TETRACYCLINE Value in next row Sensitive      SENSITIVE<=1    * HEAVY GROWTH METHICILLIN RESISTANT STAPHYLOCOCCUS AUREUS  Blood culture (routine x 2)     Status: None   Collection Time: 06/05/15  9:27 PM  Result Value Ref Range Status   Specimen Description BLOOD  Final   Special Requests BOTTLES DRAWN AEROBIC AND ANAEROBIC 4CC  Final   Culture NO GROWTH 5 DAYS  Final   Report Status 06/10/2015 FINAL   Final  Blood culture (routine x 2)     Status: None   Collection Time: 06/05/15 10:49 PM  Result Value Ref Range Status   Specimen Description BLOOD  Final   Special Requests BOTTLES DRAWN AEROBIC AND ANAEROBIC 2CC  Final   Culture  Setup Time   Final    GRAM POSITIVE COCCI AEROBIC BOTTLE ONLY CRITICAL RESULT CALLED TO, READ BACK BY AND VERIFIED WITH: ADAM SCARBOROUGH AT 1244 06/07/15 DV    Culture   Final    COAGULASE NEGATIVE STAPHYLOCOCCUS AEROBIC BOTTLE ONLY Results consistent with contamination.    Report Status 06/10/2015 FINAL  Final  Culture, respiratory (NON-Expectorated)     Status: None   Collection Time: 06/06/15 12:40 PM  Result Value Ref Range Status   Specimen Description TRACHEAL ASPIRATE  Final   Special Requests NONE  Final   Gram Stain   Final    MODERATE WBC SEEN FEW GRAM NEGATIVE RODS GOOD SPECIMEN - 80-90% WBCS    Culture   Final    LIGHT GROWTH METHICILLIN RESISTANT STAPHYLOCOCCUS AUREUS CRITICAL VALUE NOTED.  VALUE IS CONSISTENT WITH PREVIOUSLY REPORTED AND CALLED VALUE.    Report Status 06/09/2015 FINAL  Final   Organism ID, Bacteria METHICILLIN RESISTANT STAPHYLOCOCCUS AUREUS  Final      Susceptibility   Methicillin resistant staphylococcus aureus - MIC*    CIPROFLOXACIN >=8 RESISTANT Resistant     GENTAMICIN <=0.5 SENSITIVE Sensitive     OXACILLIN 1 RESISTANT Resistant     VANCOMYCIN 1 SENSITIVE Sensitive     TRIMETH/SULFA <=10 SENSITIVE Sensitive     CEFOXITIN SCREEN Value in next row Resistant      POSITIVECEFOXITIN SCREEN - This test may be used to predict mecA-mediated oxacillin resistance, and it is based on the cefoxitin disk screen test.  The cefoxitin screen and oxacillin work in combination to determine the final interpretation reported for oxacillin.     Inducible Clindamycin Value in next row Resistant      POSITIVEINDUCIBLE CLINDAMYCIN RESISTANCE - A positive ICR test is indicative of inducible resistance to macrolides, lincosamides,  and type B streptogramin.  This isolate is presumed to be resistant to Clindamycin, however, Clindamycin may still be effective in some patients.     TETRACYCLINE Value in next row Sensitive      SENSITIVE<=1    * LIGHT GROWTH METHICILLIN RESISTANT STAPHYLOCOCCUS AUREUS  Culture, blood (routine x 2)     Status: None (Preliminary result)   Collection Time: 06/09/15 12:00 PM  Result Value Ref Range Status   Specimen Description BLOOD LEFT ASSIST CONTROL  Final   Special Requests   Final    BOTTLES DRAWN AEROBIC AND ANAEROBIC 6CC AEORBIC,6CC ANAEROBIC   Culture NO GROWTH 3 DAYS  Final   Report Status PENDING  Incomplete  Culture, blood (routine x 2)     Status: None (Preliminary result)   Collection Time: 06/09/15 12:15 PM  Result Value Ref Range Status   Specimen Description BLOOD RIGHT HAND  Final  Special Requests   Final    BOTTLES DRAWN AEROBIC AND ANAEROBIC 3CC AEROBIC,3CC ANAEROBIC   Culture NO GROWTH 3 DAYS  Final   Report Status PENDING  Incomplete    Radiology Reports Dg Chest 1 View  06/10/2015   CLINICAL DATA:  Shortness of Breath  EXAM: CHEST  1 VIEW  COMPARISON:  June 09, 2015  FINDINGS: Endotracheal tube tip is 4.6 cm above the carina. Central catheter tip is in the superior cava. Nasogastric tube tip and side port are coiled within a large hiatal hernia, stable. No pneumothorax. There is hazy opacity in the right base, stable. Suspect a combination of atelectasis and small pleural effusion. There is a calcified granuloma in the left upper lobe near the apex. Lungs elsewhere clear. Heart is upper normal in size, stable. No adenopathy. No bone lesions.  IMPRESSION: Tube and catheter positions as described without pneumothorax. Large hiatal hernia. Nasogastric tube tip and side port within the hernia. Small right effusion with right base atelectasis. Granuloma left upper lobe near the apex. No new opacity. No change in cardiac silhouette.   Electronically Signed   By: Bretta Bang III M.D.   On: 06/10/2015 07:54   Dg Chest 1 View  06/05/2015   CLINICAL DATA:  Endotracheal tube placement.  Initial encounter.  EXAM: CHEST  1 VIEW  COMPARISON:  Chest radiograph performed earlier today at 8:45 p.m.  FINDINGS: The patient's endotracheal tube is seen ending 4 cm above the carina. An enteric tube is noted extending below the diaphragm.  The lungs appear clear bilaterally. No focal consolidation, pleural effusion or pneumothorax is seen. Mild scarring is suggested at the right lung apex, though this may reflect overlying tubing.  The cardiomediastinal silhouette is borderline enlarged. No acute osseous abnormalities are identified. A large paraesophageal hernia is again noted.  IMPRESSION: 1. Endotracheal tube seen ending 4 cm above the carina. 2. Lungs clear bilaterally. 3. Borderline cardiomegaly. 4. Large paraesophageal hernia again noted.   Electronically Signed   By: Roanna Raider M.D.   On: 06/05/2015 23:57   Dg Chest 1 View  06/05/2015   CLINICAL DATA:  Altered mental status. Weakness. Shortness of breath.  EXAM: CHEST  1 VIEW  COMPARISON:  One-view chest x-ray 02/22/2014.  FINDINGS: Cardiomegaly is again noted. Lung volumes are low. This is a reversed lordotic view. No focal airspace disease is evident. The visualized soft tissues and bony thorax are unremarkable.  IMPRESSION: 1. Stable cardiomegaly without failure.   Electronically Signed   By: Marin Roberts M.D.   On: 06/05/2015 21:22   Ct Head Wo Contrast  06/06/2015   CLINICAL DATA:  Acute onset of altered mental status. High blood pressure. Initial encounter.  EXAM: CT HEAD WITHOUT CONTRAST  TECHNIQUE: Contiguous axial images were obtained from the base of the skull through the vertex without intravenous contrast.  COMPARISON:  None.  FINDINGS: There is no evidence of acute infarction, mass lesion, or intra- or extra-axial hemorrhage on CT.  Prominence of the ventricles and sulci likely reflects mild cortical  volume loss. Scattered periventricular and subcortical white matter change reflects small vessel ischemic microangiopathy. Mild cerebellar atrophy is noted. Chronic ischemic change is noted at the external capsule bilaterally.  The brainstem and fourth ventricle are within normal limits. The basal ganglia are unremarkable in appearance. The cerebral hemispheres demonstrate grossly normal gray-white differentiation. No mass effect or midline shift is seen.  There is no evidence of fracture; visualized osseous structures are unremarkable  in appearance. The visualized portions of the orbits are within normal limits. The paranasal sinuses and mastoid air cells are well-aerated. No significant soft tissue abnormalities are seen.  IMPRESSION: 1. No acute intracranial pathology seen on CT. 2. Mild cortical volume loss and scattered small vessel ischemic microangiopathy.   Electronically Signed   By: Roanna Raider M.D.   On: 06/06/2015 02:15   Dg Chest Port 1 View  06/09/2015   CLINICAL DATA:  Evaluate central line placement.  EXAM: PORTABLE CHEST - 1 VIEW  COMPARISON:  06/08/2015  FINDINGS: Placement of a left jugular central line. Catheter tip in the SVC region. Again noted is a nasogastric tube coiled in a large hiatal hernia. Endotracheal tube is 2 cm above the carina. Persistent densities at the right lung base suggestive for volume loss and cannot exclude pleural effusion. Upper lungs are clear. Calcified granuloma in the left upper lung. Heart size is grossly stable.  IMPRESSION: Central line tip in the SVC region.  Negative for pneumothorax.  Support apparatuses as described.  Persistent basilar densities, particularly on the right side.  Large hiatal hernia with a nasogastric tube.   Electronically Signed   By: Richarda Overlie M.D.   On: 06/09/2015 16:10   Dg Chest Port 1 View  06/08/2015   CLINICAL DATA:  Acute on chronic respiratory failure.  Sepsis.  EXAM: PORTABLE CHEST - 1 VIEW  COMPARISON:  06/05/2015.   FINDINGS: 0608 hr. The endotracheal tube tip is 2 cm above the carina. Nasogastric tube is looped within a large hiatal hernia and its tip does not extend below the diaphragm. The heart size and mediastinal contours are stable. There are developing bibasilar airspace opacities and probable bilateral pleural effusions. No edema or pneumothorax identified. The bones appear unchanged.  IMPRESSION: Developing bibasilar airspace opacities and probable bilateral pleural effusions. Large hiatal hernia in which the nasogastric tube is looped.   Electronically Signed   By: Carey Bullocks M.D.   On: 06/08/2015 07:39     CBC  Recent Labs Lab 06/05/15 2026 06/06/15 0240 06/07/15 0315 06/08/15 0304 06/10/15 1018  WBC 14.0* 8.8 6.9 8.9 12.5*  HGB 14.5 11.1* 9.3* 11.0* 10.6*  HCT 46.0 35.9 29.3* 34.5* 33.4*  PLT 181 114* 85* 78* 60*  MCV 94.5 96.2 93.9 94.0 93.9  MCH 29.7 29.9 29.9 29.9 29.8  MCHC 31.4* 31.1* 31.8* 31.8* 31.7*  RDW 18.2* 17.0* 17.7* 17.5* 17.9*  LYMPHSABS 1.2  --   --   --   --   MONOABS 1.0*  --   --   --   --   EOSABS 0.0  --   --   --   --   BASOSABS 0.0  --   --   --   --     Chemistries   Recent Labs Lab 06/05/15 2140 06/06/15 0240 06/07/15 0315 06/08/15 0304 06/09/15 1215 06/10/15 1018  NA 148* 148* 146* 146* 147* 140  K 5.0 4.5 4.3 4.1 3.7 3.7  CL 107 113* 116* 118* 117* 109  CO2 34* 30 26 24 26 28   GLUCOSE 105* 103* 116* 222* 154* 109*  BUN 57* 52* 48* 44* 46* 57*  CREATININE 1.59* 1.45* 1.76* 1.57* 1.34* 1.72*  CALCIUM 13.3* 11.4* 10.6* 10.9* 11.4* 10.8*  MG  --   --   --   --  1.9  --   AST 16 18  --   --   --   --   ALT 18 14  --   --   --   --  ALKPHOS 106 76  --   --   --   --   BILITOT 1.3* 0.9  --   --   --   --    ------------------------------------------------------------------------------------------------------------------ estimated creatinine clearance is 22.6 mL/min (by C-G formula based on Cr of  1.72). ------------------------------------------------------------------------------------------------------------------ No results for input(s): HGBA1C in the last 72 hours. ------------------------------------------------------------------------------------------------------------------ No results for input(s): CHOL, HDL, LDLCALC, TRIG, CHOLHDL, LDLDIRECT in the last 72 hours. ------------------------------------------------------------------------------------------------------------------ No results for input(s): TSH, T4TOTAL, T3FREE, THYROIDAB in the last 72 hours.  Invalid input(s): FREET3 ------------------------------------------------------------------------------------------------------------------ No results for input(s): VITAMINB12, FOLATE, FERRITIN, TIBC, IRON, RETICCTPCT in the last 72 hours.  Coagulation profile No results for input(s): INR, PROTIME in the last 168 hours.  No results for input(s): DDIMER in the last 72 hours.  Cardiac Enzymes  Recent Labs Lab 06/05/15 2140  TROPONINI 0.04*   ------------------------------------------------------------------------------------------------------------------ Invalid input(s): POCBNP    Assessment & Plan   1. Acute respiratory failure with hypercapnia: This is likely due to combination of acute on chronic COPD exasperation, as well as recent narcotic treatment for her back pain.  2.Sepsis due to MRSA pneumonia and lower extremity cellulitis. She was on vancomycin. 3. Essential hypertension. 4. Acute renal failure likely ATN: Labile  5. Acute diastolic CHF.was on Lasix.   The patient was extubated 2 days ago and placed on comfort care. I discussed with the patient's daughters about the patient's condition, very poor prognosis and  hospice home discharge.  I discussed with Dr. Orvan Falconer, palliative care physician, the case manager and social worker. No hospice home is available today.   CODE STATUSDO NOT RESUSCITATE      Code Status Orders        Start     Ordered   06/06/15 1049  Do not attempt resuscitation (DNR)   Continuous    Question Answer Comment  In the event of cardiac or respiratory ARREST Do not call a "code blue"   In the event of cardiac or respiratory ARREST Do not perform Intubation, CPR, defibrillation or ACLS   In the event of cardiac or respiratory ARREST Use medication by any route, position, wound care, and other measures to relive pain and suffering. May use oxygen, suction and manual treatment of airway obstruction as needed for comfort.      06/06/15 1048    Advance Directive Documentation        Most Recent Value   Type of Advance Directive  Living will, Healthcare Power of Gerrit Friends [Pt's daughter [name] Honeycutt]   Pre-existing out of facility DNR order (yellow form or pink MOST form)     "MOST" Form in Place?        Consults  pulmonary critical care DVT Prophylaxis  heparin  Lab Results  Component Value Date   PLT 60* 06/10/2015     Time Spent in 37 minutes    Shaune Pollack M.D on 06/12/2015 at 2:51 PM  Between 7am to 6pm - Pager - [phone]  After 6pm go to [email or web address] - password [email or web address] [facility]  [facility] [place] [facility]   Office  [phone]

## 2015-06-13 MED ORDER — MORPHINE SULFATE (CONCENTRATE) 10 MG/0.5ML PO SOLN: 10.0000 mg | ORAL | Status: DC | PRN

## 2015-06-13 MED ORDER — ONDANSETRON HCL 4 MG PO TABS: 4.0000 mg | ORAL_TABLET | Freq: Four times a day (QID) | ORAL | Status: AC | PRN

## 2015-06-13 MED ORDER — PROCHLORPERAZINE MALEATE 10 MG PO TABS: 10.0000 mg | ORAL_TABLET | Freq: Four times a day (QID) | ORAL | Status: AC | PRN

## 2015-06-13 MED ORDER — MORPHINE SULFATE (CONCENTRATE) 10 MG/0.5ML PO SOLN: 5.0000 mg | ORAL | Status: AC | PRN

## 2015-06-13 MED ORDER — BISACODYL 10 MG RE SUPP: 10.0000 mg | RECTAL | Status: AC | PRN

## 2015-06-13 MED ORDER — LORAZEPAM 0.5 MG PO TABS: 0.5000 mg | ORAL_TABLET | ORAL | Status: AC | PRN

## 2015-06-13 NOTE — Clinical Social Work Note (Signed)
Patient has hospice home bed for today. CSW has spoken to [place] at [facility] and faxed discharge summary to her. CSW met with patient's family in patient's room this morning and they are aware and in agreement. Shela Leff MSW,LCSW [phone]

## 2015-06-13 NOTE — Discharge Instructions (Signed)
Acute Respiratory Failure °Respiratory failure is when your lungs are not working well and your breathing (respiratory) system fails. When respiratory failure occurs, it is difficult for your lungs to get enough oxygen, get rid of carbon dioxide, or both. Respiratory failure can be life threatening.  °Respiratory failure can be acute or chronic. Acute respiratory failure is sudden, severe, and requires emergency medical treatment. Chronic respiratory failure is less severe, happens over time, and requires ongoing treatment.  °WHAT ARE THE CAUSES OF ACUTE RESPIRATORY FAILURE?  °Any problem affecting the heart or lungs can cause acute respiratory failure. Some of these causes include the following: °· Chronic bronchitis and emphysema (COPD).   °· Blood clot going to a lung (pulmonary embolism).   °· Having water in the lungs caused by heart failure, lung injury, or infection (pulmonary edema).   °· Collapsed lung (pneumothorax).   °· Pneumonia.   °· Pulmonary fibrosis.   °· Obesity.   °· Asthma.   °· Heart failure.   °· Any type of trauma to the chest that can make breathing difficult.   °· Nerve or muscle diseases making chest movements difficult. °WHAT SYMPTOMS SHOULD YOU WATCH FOR?  °If you have any of these signs or symptoms, you should seek immediate medical care:  °· You have shortness of breath (dyspnea) with or without activity.   °· You have rapid, fast breathing (tachypnea).   °· You are wheezing. °· You are unable to say more than a few words without having to catch your breath. °· You find it very difficult to function normally. °· You have a fast heart rate.   °· You have a bluish color to your finger or toe nail beds.   °· You have confusion or drowsiness or both.   °HOW WILL MY ACUTE RESPIRATORY FAILURE BE TREATED?  °Treatment of acute respiratory failure depends on the cause of the respiratory failure. Usually, you will stay in the intensive care unit so your breathing can be watched closely. Treatment  can include the following: °· Oxygen. Oxygen can be delivered through the following: °¨ Nasal cannula. This is small tubing that goes in your nose to give you oxygen. °¨ Face mask. A face mask covers your nose and mouth to give you oxygen. °· Medicine. Different medicines can be given to help with breathing. These can include: °¨ Nebulizers. Nebulizers deliver medicines to open the air passages (bronchodilators). These medicines help to open or relax the airways in the lungs so you can breathe better. They can also help loosen mucus from your lungs. °¨ Diuretics. Diuretic medicines can help you breathe better by getting rid of extra water in your body. °¨ Steroids. Steroid medicines can help decrease swelling (inflammation) in your lungs. °¨ Antibiotics. °· Chest tube. If you have a collapsed lung (pneumothorax), a chest tube is placed to help reinflate the lung. °· Non-invasive positive pressure ventilation (NPPV). This is a tight-fitting mask that goes over your nose and mouth. The mask has tubing that is attached to a machine. The machine blows air into the tubing, which helps to keep the tiny air sacs (alveoli) in your lungs open. This machine allows you to breathe on your own. °· Ventilator. A ventilator is a breathing machine. When on a ventilator, a breathing tube is put into the lungs. A ventilator is used when you can no longer breathe well enough on your own. You may have low oxygen levels or high carbon dioxide (CO2) levels in your blood. When you are on a ventilator, sedation and pain medicines are given to make you sleep   so your lungs can heal. °Document Released: 10/15/2013 Document Revised: 02/24/2014 Document Reviewed: 10/15/2013 °Exit[employer]® Patient Information ©2015 [employer]. This information is not intended to replace advice given to you by your health care provider. Make sure you discuss any questions you have with your health care provider. ° °

## 2015-06-13 NOTE — Progress Notes (Addendum)
Report called to [place] at hospice home. Will call EMS for transport. Central line removed per Dr. Renae Gloss. EMS transported patient.

## 2015-06-13 NOTE — Progress Notes (Addendum)
Pt is comfort care only. Pt appears to be resting comfortably. Denies pain. Family is at bedside. Pt awaiting bed at hospice. Oral care performed and pt repositioned.   Orson Ape, RN

## 2015-06-13 NOTE — Progress Notes (Signed)
Patient ID: Priscilla Powers, female   DOB: 03/13/1930, 81 y.o.   MRN: [id number] [facility] PROGRESS NOTE  PCP: No primary care provider on file.  HPI/Subjective: Patient asking when she can get some water. Offers no complaints of pain or shortness of breath. As per daughter she was coughing.  Objective: Filed Vitals:   06/11/15 1000  BP: 125/62  Pulse: 117  Temp: 97.2 F (36.2 C)  Resp: 7    Filed Weights   06/08/15 0438 06/09/15 0500 06/10/15 0445  Weight: 65 kg (143 lb 4.8 oz) 64.1 kg (141 lb 5 oz) 65.6 kg (144 lb 10 oz)    ROS: Review of Systems  Constitutional: Negative for fever.  HENT: Positive for hearing loss.   Respiratory: Positive for cough and shortness of breath.   Cardiovascular: Negative for chest pain.  Gastrointestinal: Negative for nausea, vomiting and abdominal pain.  Musculoskeletal: Negative for myalgias.  Neurological: Negative for dizziness.   Exam: Physical Exam  HENT:  Nose: No mucosal edema.  Mouth/Throat: No oropharyngeal exudate or posterior oropharyngeal edema.  Eyes: Conjunctivae, EOM and lids are normal. Pupils are equal, round, and reactive to light.  Neck: No JVD present. Carotid bruit is not present. No edema present. No thyroid mass and no thyromegaly present.  Cardiovascular: S1 normal and S2 normal.  Tachycardia present.  Exam reveals no gallop.   No murmur heard. Pulses:      Dorsalis pedis pulses are 1+ on the right side, and 1+ on the left side.  Respiratory: No respiratory distress. She has no wheezes. She has no rhonchi. She has no rales.  GI: Soft. Bowel sounds are normal. There is no tenderness.  Musculoskeletal:       Right ankle: She exhibits no swelling.       Left ankle: She exhibits no swelling.  Lymphadenopathy:    She has no cervical adenopathy.  Neurological: She is alert.  Skin: Skin is warm. Nails show no clubbing.  Bilateral lower extremity ulcerations. Only slight erythema surrounding.   Psychiatric: She has a normal mood and affect.    Data Reviewed: Basic Metabolic Panel:  Recent Labs Lab 06/07/15 0315 06/08/15 0304 06/09/15 1215 06/10/15 1018  NA 146* 146* 147* 140  K 4.3 4.1 3.7 3.7  CL 116* 118* 117* 109  CO2 GLUCOSE 116* 222* 154* 109*  BUN 48* 44* 46* 57*  CREATININE 1.76* 1.57* 1.34* 1.72*  CALCIUM 10.6* 10.9* 11.4* 10.8*  MG  --   --  1.9  --      Recent Labs Lab 06/07/15 0315 06/08/15 0304 06/10/15 1018  WBC 6.9 8.9 12.5*  HGB 9.3* 11.0* 10.6*  HCT 29.3* 34.5* 33.4*  MCV 93.9 94.0 93.9  PLT 85* 78* 60*    Assessment/Plan:  1. Acute respiratory failure with hypercapnia. Likely combination of COPD exacerbation and pneumonia. Continue oxygen supplementation. 2. Clinical sepsis, MRSA pneumonia and lower extremity cellulitis. Patient made comfort care and is waiting hospice home. 3. Essential hypertension and tachycardia 4. Acute renal failure likely ATN 5. History of diastolic congestive heart failure 6. Awaiting hospice home bed. Patient currently on fentanyl drip and comfortable. We'll start diet since she is alert. Keep Foley in. Once excepted to hospice home will discharge.  Code Status:     Code Status Orders        Start     Ordered   06/06/15 1049  Do not attempt resuscitation (DNR)   Continuous  Question Answer Comment  In the event of cardiac or respiratory ARREST Do not call a "code blue"   In the event of cardiac or respiratory ARREST Do not perform Intubation, CPR, defibrillation or ACLS   In the event of cardiac or respiratory ARREST Use medication by any route, position, wound care, and other measures to relive pain and suffering. May use oxygen, suction and manual treatment of airway obstruction as needed for comfort.      06/06/15 1048    Advance Directive Documentation        Most Recent Value   Type of Advance Directive  Living will, Healthcare Power of Gerrit Friends [Pt's daughter [name] Honeycutt]    Pre-existing out of facility DNR order (yellow form or pink MOST form)     "MOST" Form in Place?       Family Communication: Daughter at the bedside Disposition Plan: Hospice home when bed available.  Time spent: 30 minutes.  Alford Highland  [facility]

## 2015-06-13 NOTE — Discharge Summary (Signed)
[facility] [place] [facility]   PATIENT NAME: Priscilla Powers    MR#:  [id number]  DATE OF BIRTH:  1930/01/05  DATE OF ADMISSION:  06/05/2015 ADMITTING PHYSICIAN: Wyatt Haste, MD  DATE OF DISCHARGE: No discharge date for patient encounter.  PRIMARY CARE PHYSICIAN: No primary care provider on file.    ADMISSION DIAGNOSIS:  Hypercalcemia [E83.52] Renal insufficiency [N28.9] Bilateral cellulitis of lower leg [L03.116, L03.115] Acute respiratory failure, unspecified whether with hypoxia or hypercapnia [J96.00] Sepsis, due to unspecified organism [A41.9]  DISCHARGE DIAGNOSIS:  Active Problems:   Acute respiratory failure with hypercapnia   Sepsis   Acute on chronic respiratory failure with hypoxia   SECONDARY DIAGNOSIS:   Past Medical History  Diagnosis Date  . COPD (chronic obstructive pulmonary disease)   . CHF (congestive heart failure)   . Hypertension     HOSPITAL COURSE:   1. Acute respiratory failure with hypercapnia. Currently the patient is breathing comfortably on nasal cannula. Likely combination of pneumonia COPD exacerbation. 2. Clinical sepsis, MRSA pneumonia and lower extremity cellulitis. Patient was made comfort care and antibiotics were stopped. 3. Essential hypertension and tachycardia 4. History of congestive heart failure.  DISCHARGE CONDITIONS:   Guarded, patient is a DO NOT RESUSCITATE. Disposition to hospice home when bed available  CONSULTS OBTAINED:  Treatment Team:  Wyatt Haste, MD Shaune Pollack, MD  DRUG ALLERGIES:   Allergies  Allergen Reactions  . Penicillins Other (See Comments)    Reaction:  Unknown     DISCHARGE MEDICATIONS:   Current Discharge Medication List    START taking these medications   Details  LORazepam (ATIVAN) 0.5 MG tablet Take 1 tablet (0.5 mg total) by mouth every 4 (four) hours as needed for anxiety. Qty: 30 tablet, Refills: 0    Morphine Sulfate (MORPHINE CONCENTRATE) 10  MG/0.5ML SOLN concentrated solution Take 0.5 mLs (10 mg total) by mouth every hour as needed for severe pain. Qty: 42 mL, Refills: 0    ondansetron (ZOFRAN) 4 MG tablet Take 1 tablet (4 mg total) by mouth every 6 (six) hours as needed for nausea. Qty: 20 tablet, Refills: 0      STOP taking these medications     Fluticasone-Salmeterol (ADVAIR) 250-50 MCG/DOSE AEPB      furosemide (LASIX) 40 MG tablet      levofloxacin (LEVAQUIN) 750 MG tablet      potassium chloride (K-DUR) 10 MEQ tablet      propranolol (INDERAL) 40 MG tablet      spironolactone (ALDACTONE) 25 MG tablet      tiotropium (SPIRIVA) 18 MCG inhalation capsule      nitrofurantoin, macrocrystal-monohydrate, (MACROBID) 100 MG capsule      oxyCODONE-acetaminophen (ROXICET) 5-325 MG per tablet          DISCHARGE INSTRUCTIONS:   Discharged to hospice home when bed available. Diet as tolerated Foley catheter for comfort.  If you experience worsening of your admission symptoms, develop shortness of breath, life threatening emergency, suicidal or homicidal thoughts you must seek medical attention immediately by calling 911 or calling your MD immediately  if symptoms less severe.  You Must read complete instructions/literature along with all the possible adverse reactions/side effects for all the Medicines you take and that have been prescribed to you. Take any new Medicines after you have completely understood and accept all the possible adverse reactions/side effects.   Please note  You were cared for by a hospitalist during your hospital stay. If  you have any questions about your discharge medications or the care you received while you were in the hospital after you are discharged, you can call the unit and asked to speak with the hospitalist on call if the hospitalist that took care of you is not available. Once you are discharged, your primary care physician will handle any further medical issues. Please note that NO  REFILLS for any discharge medications will be authorized once you are discharged, as it is imperative that you return to your primary care physician (or establish a relationship with a primary care physician if you do not have one) for your aftercare needs so that they can reassess your need for medications and monitor your lab values.    Today   CHIEF COMPLAINT:   Chief Complaint  Patient presents with  . Altered Mental Status  . Weakness    HISTORY OF PRESENT ILLNESS:  Priscilla Powers  is a 81 y.o. female with a known history of CHF, COPD, hypertension presented with altered mental status and weakness. Found to have clinical sepsis with pneumonia and acute respiratory failure   VITAL SIGNS:  Blood pressure 125/62, pulse 117, temperature 97.2 F (36.2 C), temperature source Other (Comment), resp. rate 7, height 5\' 3"  (1.6 m), weight 65.6 kg (144 lb 10 oz), SpO2 100 %.   DATA REVIEW:   CBC  Recent Labs Lab 06/10/15 1018  WBC 12.5*  HGB 10.6*  HCT 33.4*  PLT 60*    Chemistries   Recent Labs Lab 06/09/15 1215 06/10/15 1018  NA 147* 140  K 3.7 3.7  CL 117* 109  CO2 26 28  GLUCOSE 154* 109*  BUN 46* 57*  CREATININE 1.34* 1.72*  CALCIUM 11.4* 10.8*  MG 1.9  --     Cardiac Enzymes No results for input(s): TROPONINI in the last 168 hours.  Microbiology Results  Results for orders placed or performed during the hospital encounter of 06/05/15  MRSA PCR Screening     Status: Abnormal   Collection Time: 06/05/15  8:16 PM  Result Value Ref Range Status   MRSA by PCR POSITIVE (A) NEGATIVE Final    Comment:        The GeneXpert MRSA Assay (FDA approved for NASAL specimens only), is one component of a comprehensive MRSA colonization surveillance program. It is not intended to diagnose MRSA infection nor to guide or monitor treatment for MRSA infections. CRITICAL RESULT CALLED TO, READ BACK BY AND VERIFIED WITH: CHRISTY AUSTIN AT 0443 06/06/15 SDR   Wound culture      Status: None   Collection Time: 06/05/15  8:54 PM  Result Value Ref Range Status   Specimen Description ABSCESS  Final   Special Requests Normal  Final   Gram Stain RARE WBC SEEN FEW GRAM POSITIVE COCCI IN PAIRS   Final   Culture   Final    HEAVY GROWTH METHICILLIN RESISTANT STAPHYLOCOCCUS AUREUS CRITICAL RESULT CALLED TO, READ BACK BY AND VERIFIED WITH: ADAM SCARBOROUGH AT 1126 06/07/15 DV    Report Status 06/09/2015 FINAL  Final   Organism ID, Bacteria METHICILLIN RESISTANT STAPHYLOCOCCUS AUREUS  Final      Susceptibility   Methicillin resistant staphylococcus aureus - MIC*    CIPROFLOXACIN >=8 RESISTANT Resistant     GENTAMICIN <=0.5 SENSITIVE Sensitive     OXACILLIN >=4 RESISTANT Resistant     VANCOMYCIN <=0.5 SENSITIVE Sensitive     TRIMETH/SULFA <=10 SENSITIVE Sensitive     CEFOXITIN SCREEN Value in next row  Resistant      POSITIVECEFOXITIN SCREEN - This test may be used to predict mecA-mediated oxacillin resistance, and it is based on the cefoxitin disk screen test.  The cefoxitin screen and oxacillin work in combination to determine the final interpretation reported for oxacillin.     Inducible Clindamycin Value in next row Resistant      POSITIVEINDUCIBLE CLINDAMYCIN RESISTANCE - A positive ICR test is indicative of inducible resistance to macrolides, lincosamides, and type B streptogramin.  This isolate is presumed to be resistant to Clindamycin, however, Clindamycin may still be effective in some patients.     TETRACYCLINE Value in next row Sensitive      SENSITIVE<=1    * HEAVY GROWTH METHICILLIN RESISTANT STAPHYLOCOCCUS AUREUS  Blood culture (routine x 2)     Status: None   Collection Time: 06/05/15  9:27 PM  Result Value Ref Range Status   Specimen Description BLOOD  Final   Special Requests BOTTLES DRAWN AEROBIC AND ANAEROBIC 4CC  Final   Culture NO GROWTH 5 DAYS  Final   Report Status 06/10/2015 FINAL  Final  Blood culture (routine x 2)     Status: None    Collection Time: 06/05/15 10:49 PM  Result Value Ref Range Status   Specimen Description BLOOD  Final   Special Requests BOTTLES DRAWN AEROBIC AND ANAEROBIC 2CC  Final   Culture  Setup Time   Final    GRAM POSITIVE COCCI AEROBIC BOTTLE ONLY CRITICAL RESULT CALLED TO, READ BACK BY AND VERIFIED WITH: ADAM SCARBOROUGH AT 1244 06/07/15 DV    Culture   Final    COAGULASE NEGATIVE STAPHYLOCOCCUS AEROBIC BOTTLE ONLY Results consistent with contamination.    Report Status 06/10/2015 FINAL  Final  Culture, respiratory (NON-Expectorated)     Status: None   Collection Time: 06/06/15 12:40 PM  Result Value Ref Range Status   Specimen Description TRACHEAL ASPIRATE  Final   Special Requests NONE  Final   Gram Stain   Final    MODERATE WBC SEEN FEW GRAM NEGATIVE RODS GOOD SPECIMEN - 80-90% WBCS    Culture   Final    LIGHT GROWTH METHICILLIN RESISTANT STAPHYLOCOCCUS AUREUS CRITICAL VALUE NOTED.  VALUE IS CONSISTENT WITH PREVIOUSLY REPORTED AND CALLED VALUE.    Report Status 06/09/2015 FINAL  Final   Organism ID, Bacteria METHICILLIN RESISTANT STAPHYLOCOCCUS AUREUS  Final      Susceptibility   Methicillin resistant staphylococcus aureus - MIC*    CIPROFLOXACIN >=8 RESISTANT Resistant     GENTAMICIN <=0.5 SENSITIVE Sensitive     OXACILLIN 1 RESISTANT Resistant     VANCOMYCIN 1 SENSITIVE Sensitive     TRIMETH/SULFA <=10 SENSITIVE Sensitive     CEFOXITIN SCREEN Value in next row Resistant      POSITIVECEFOXITIN SCREEN - This test may be used to predict mecA-mediated oxacillin resistance, and it is based on the cefoxitin disk screen test.  The cefoxitin screen and oxacillin work in combination to determine the final interpretation reported for oxacillin.     Inducible Clindamycin Value in next row Resistant      POSITIVEINDUCIBLE CLINDAMYCIN RESISTANCE - A positive ICR test is indicative of inducible resistance to macrolides, lincosamides, and type B streptogramin.  This isolate is presumed to be  resistant to Clindamycin, however, Clindamycin may still be effective in some patients.     TETRACYCLINE Value in next row Sensitive      SENSITIVE<=1    * LIGHT GROWTH METHICILLIN RESISTANT STAPHYLOCOCCUS AUREUS  Culture, blood (  routine x 2)     Status: None (Preliminary result)   Collection Time: 06/09/15 12:00 PM  Result Value Ref Range Status   Specimen Description BLOOD LEFT ASSIST CONTROL  Final   Special Requests   Final    BOTTLES DRAWN AEROBIC AND ANAEROBIC 6CC AEORBIC,6CC ANAEROBIC   Culture NO GROWTH 4 DAYS  Final   Report Status PENDING  Incomplete  Culture, blood (routine x 2)     Status: None (Preliminary result)   Collection Time: 06/09/15 12:15 PM  Result Value Ref Range Status   Specimen Description BLOOD RIGHT HAND  Final   Special Requests   Final    BOTTLES DRAWN AEROBIC AND ANAEROBIC 3CC AEROBIC,3CC ANAEROBIC   Culture NO GROWTH 4 DAYS  Final   Report Status PENDING  Incomplete    Management plans discussed with the patient, family and they are in agreement.  CODE STATUS:     Code Status Orders        Start     Ordered   06/06/15 1049  Do not attempt resuscitation (DNR)   Continuous    Question Answer Comment  In the event of cardiac or respiratory ARREST Do not call a "code blue"   In the event of cardiac or respiratory ARREST Do not perform Intubation, CPR, defibrillation or ACLS   In the event of cardiac or respiratory ARREST Use medication by any route, position, wound care, and other measures to relive pain and suffering. May use oxygen, suction and manual treatment of airway obstruction as needed for comfort.      06/06/15 1048    Advance Directive Documentation        Most Recent Value   Type of Advance Directive  Living will, Healthcare Power of Gerrit Friends [Pt's daughter [name] Honeycutt]   Pre-existing out of facility DNR order (yellow form or pink MOST form)     "MOST" Form in Place?        TOTAL TIME TAKING CARE OF THIS PATIENT: 30 minutes.     Alford Highland M.D on 06/13/2015 at 8:49 AM  Between 7am to 6pm - Pager - [phone]  After 6pm go to [email or web address] - password [email or web address] [facility]  [facility]  Office  [phone]  CC: Primary care physician; No primary care provider on file.

## 2015-06-14 LAB — CULTURE, BLOOD (ROUTINE X 2)
CULTURE: NO GROWTH
Culture: NO GROWTH

## 2015-06-25 DEATH — deceased

## 2015-12-05 IMAGING — CT CT HEAD W/O CM
2 of 3 series · 16 of 30 positions shown, 19 images · non-contrast
Comparison: None.

CLINICAL DATA: Acute onset of altered mental status. High blood
pressure. Initial encounter.

EXAM:
CT HEAD WITHOUT CONTRAST
TECHNIQUE: Contiguous axial images were obtained from the base of the skull
through the vertex without intravenous contrast.

[Series 3: head wo · axial · 0.41mm/px · z∈[-142,-2]mm · 9 of 36 slices shown, 12 images]
[im 4/36  brain]
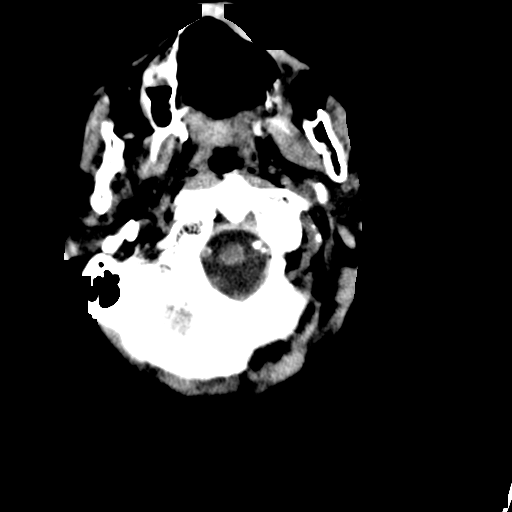
[im 4/36  bone]
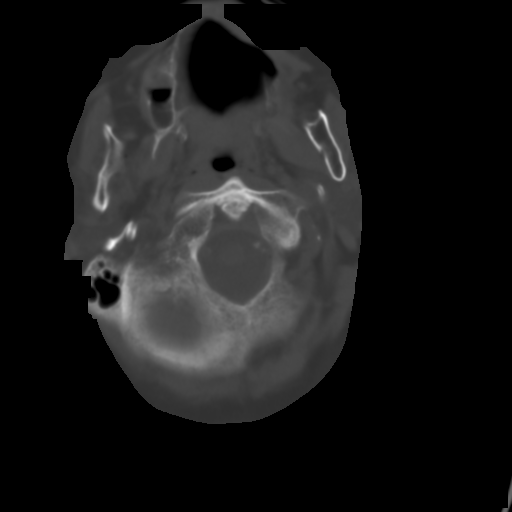
[im 8/36  brain]
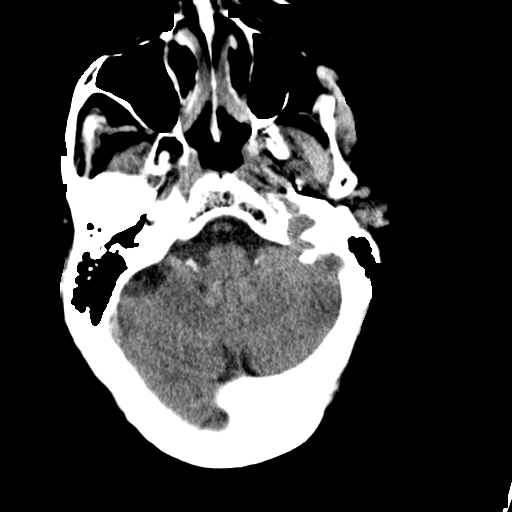
[im 11/36  brain]
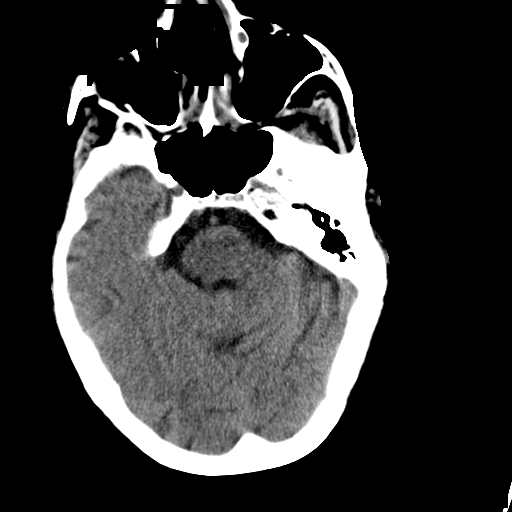
[im 15/36  brain]
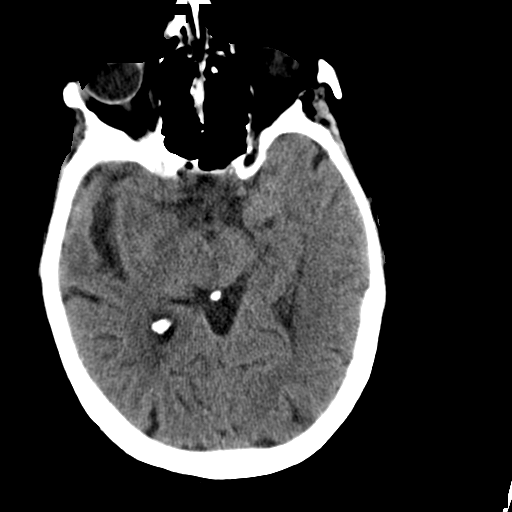
[im 18/36  brain]
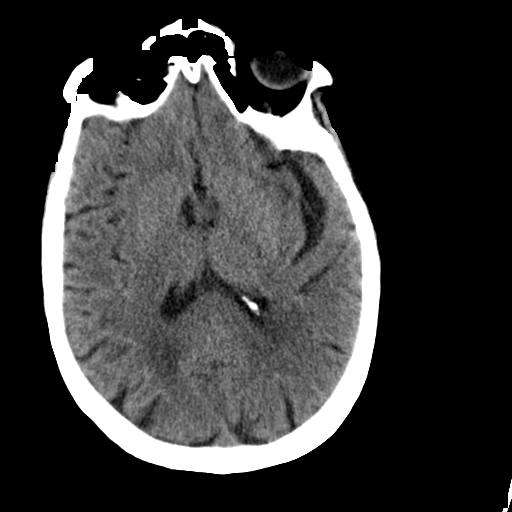
[im 18/36  bone]
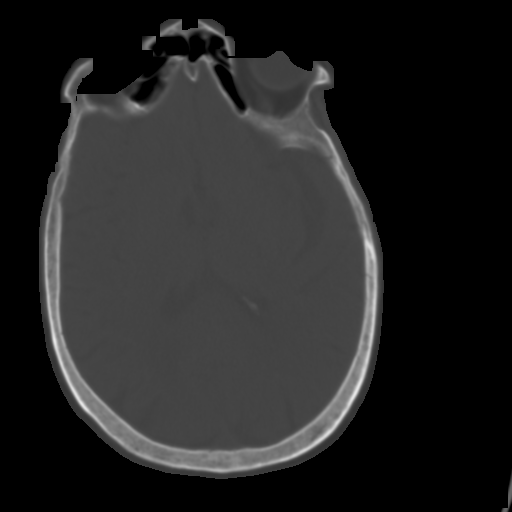
[im 22/36  brain]
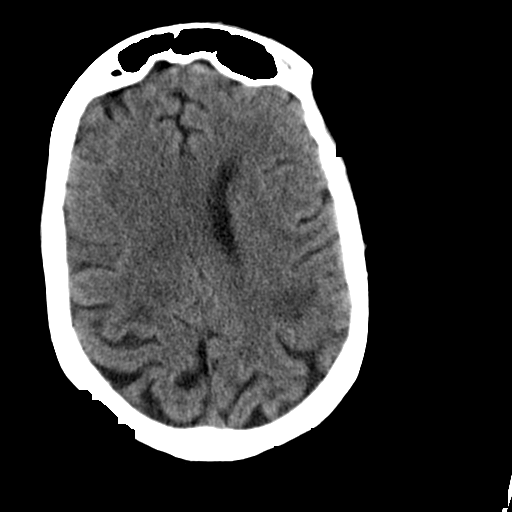
[im 25/36  brain]
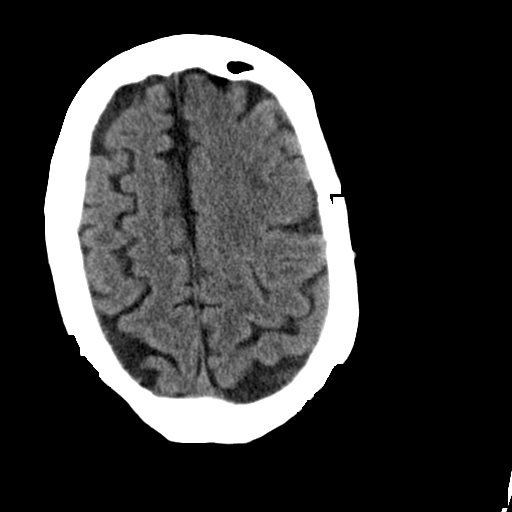
[im 29/36  brain]
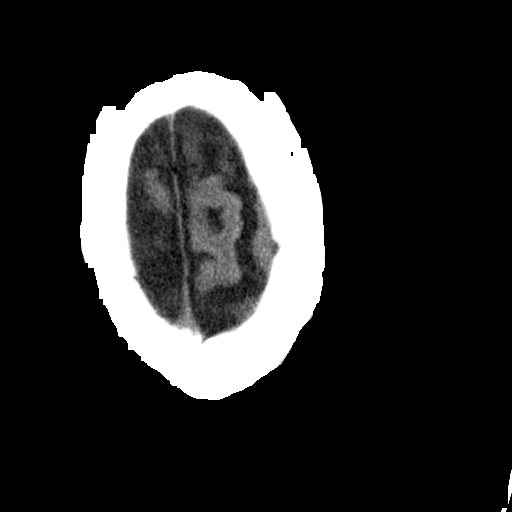
[im 32/36  brain]
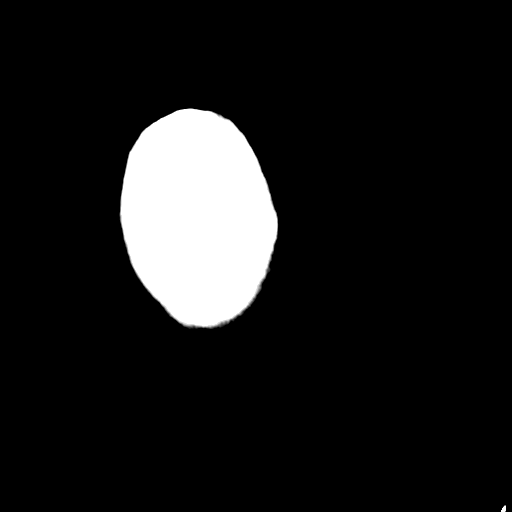
[im 32/36  bone]
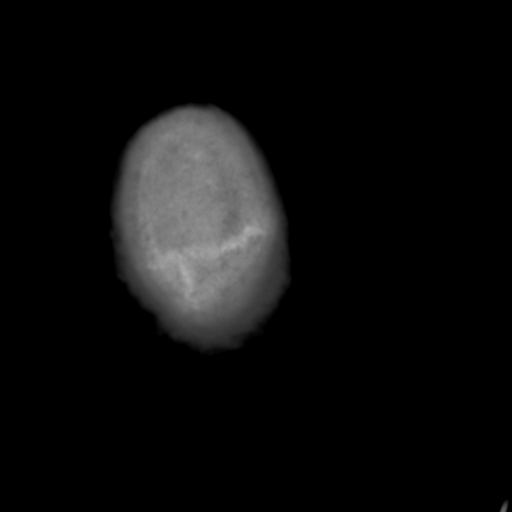

[Series 4: head wo recons · axial · 0.41mm/px · z∈[-60,+17]mm · 7 of 30 slices shown]
[im 4/30  brain]
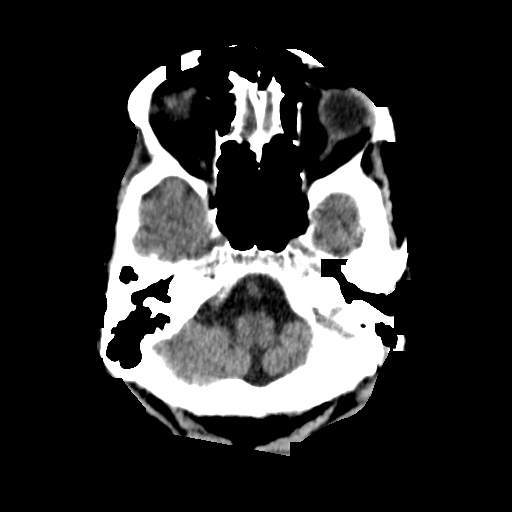
[im 7/30  brain]
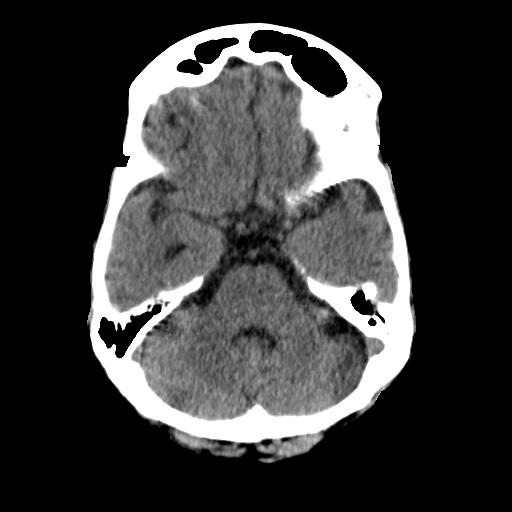
[im 10/30  brain]
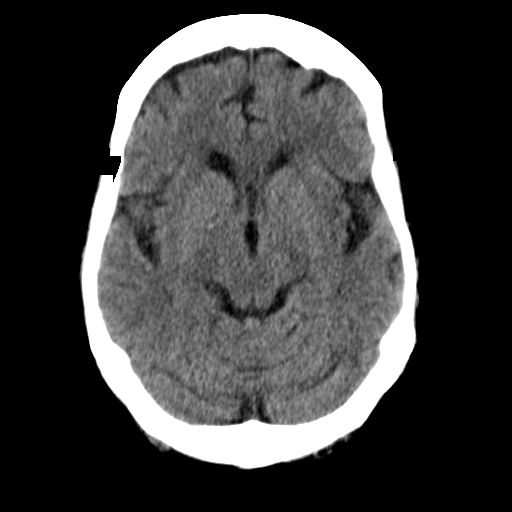
[im 13/30  brain]
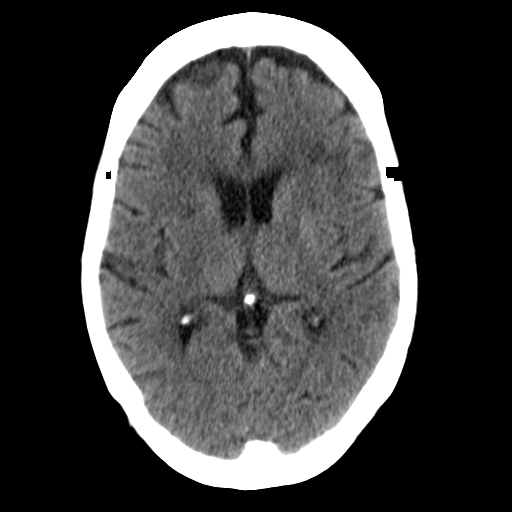
[im 17/30  brain]
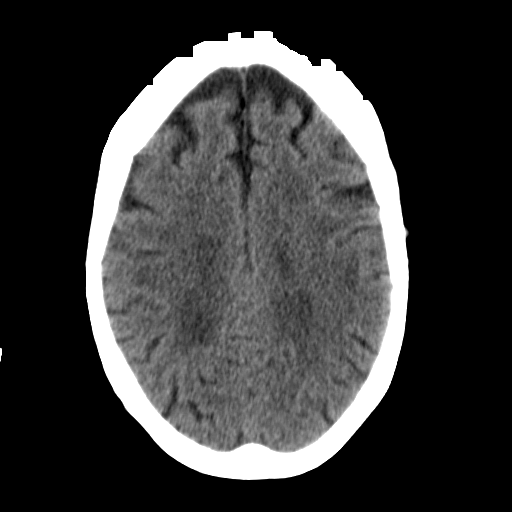
[im 20/30  brain]
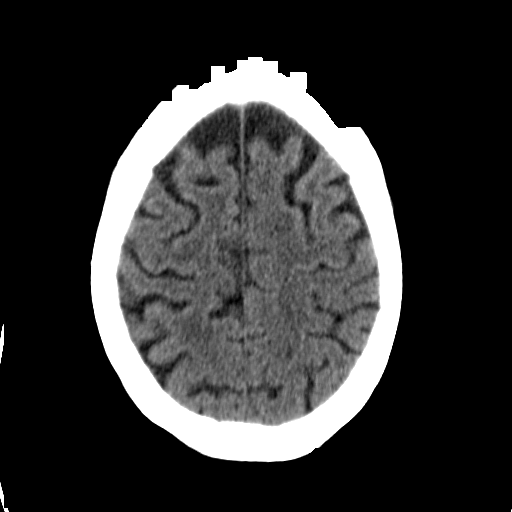
[im 23/30  brain]
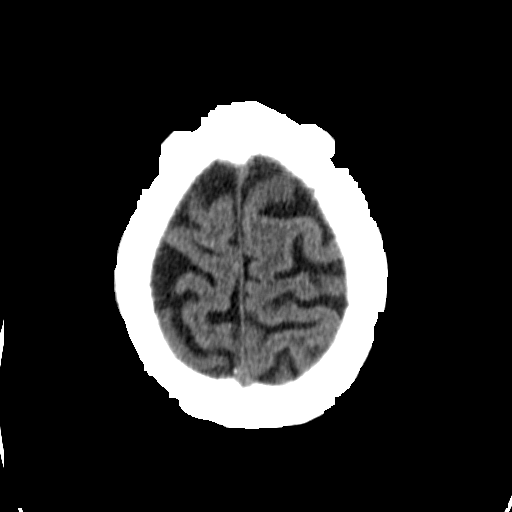

[16 of 30 positions shown; findings below may reference images not displayed]

FINDINGS: There is no evidence of acute infarction, mass lesion, or intra- or
extra-axial hemorrhage on CT.

Prominence of the ventricles and sulci likely reflects mild cortical
volume loss. Scattered periventricular and subcortical white matter
change reflects small vessel ischemic microangiopathy. Mild
cerebellar atrophy is noted. Chronic ischemic change is noted at the
external capsule bilaterally.

The brainstem and fourth ventricle are within normal limits. The
basal ganglia are unremarkable in appearance. The cerebral
hemispheres demonstrate grossly normal gray-white differentiation.
No mass effect or midline shift is seen.

There is no evidence of fracture; visualized osseous structures are
unremarkable in appearance. The visualized portions of the orbits
are within normal limits. The paranasal sinuses and mastoid air
cells are well-aerated. No significant soft tissue abnormalities are
seen.
IMPRESSION: 1. No acute intracranial pathology seen on CT.
2. Mild cortical volume loss and scattered small vessel ischemic
microangiopathy.

## 2015-12-08 IMAGING — CR DG CHEST 1V PORT
1 series · 1 of 1 positions shown · non-contrast
Comparison: 06/08/2015

CLINICAL DATA: Evaluate central line placement.

EXAM:
PORTABLE CHEST - 1 VIEW

[ap]
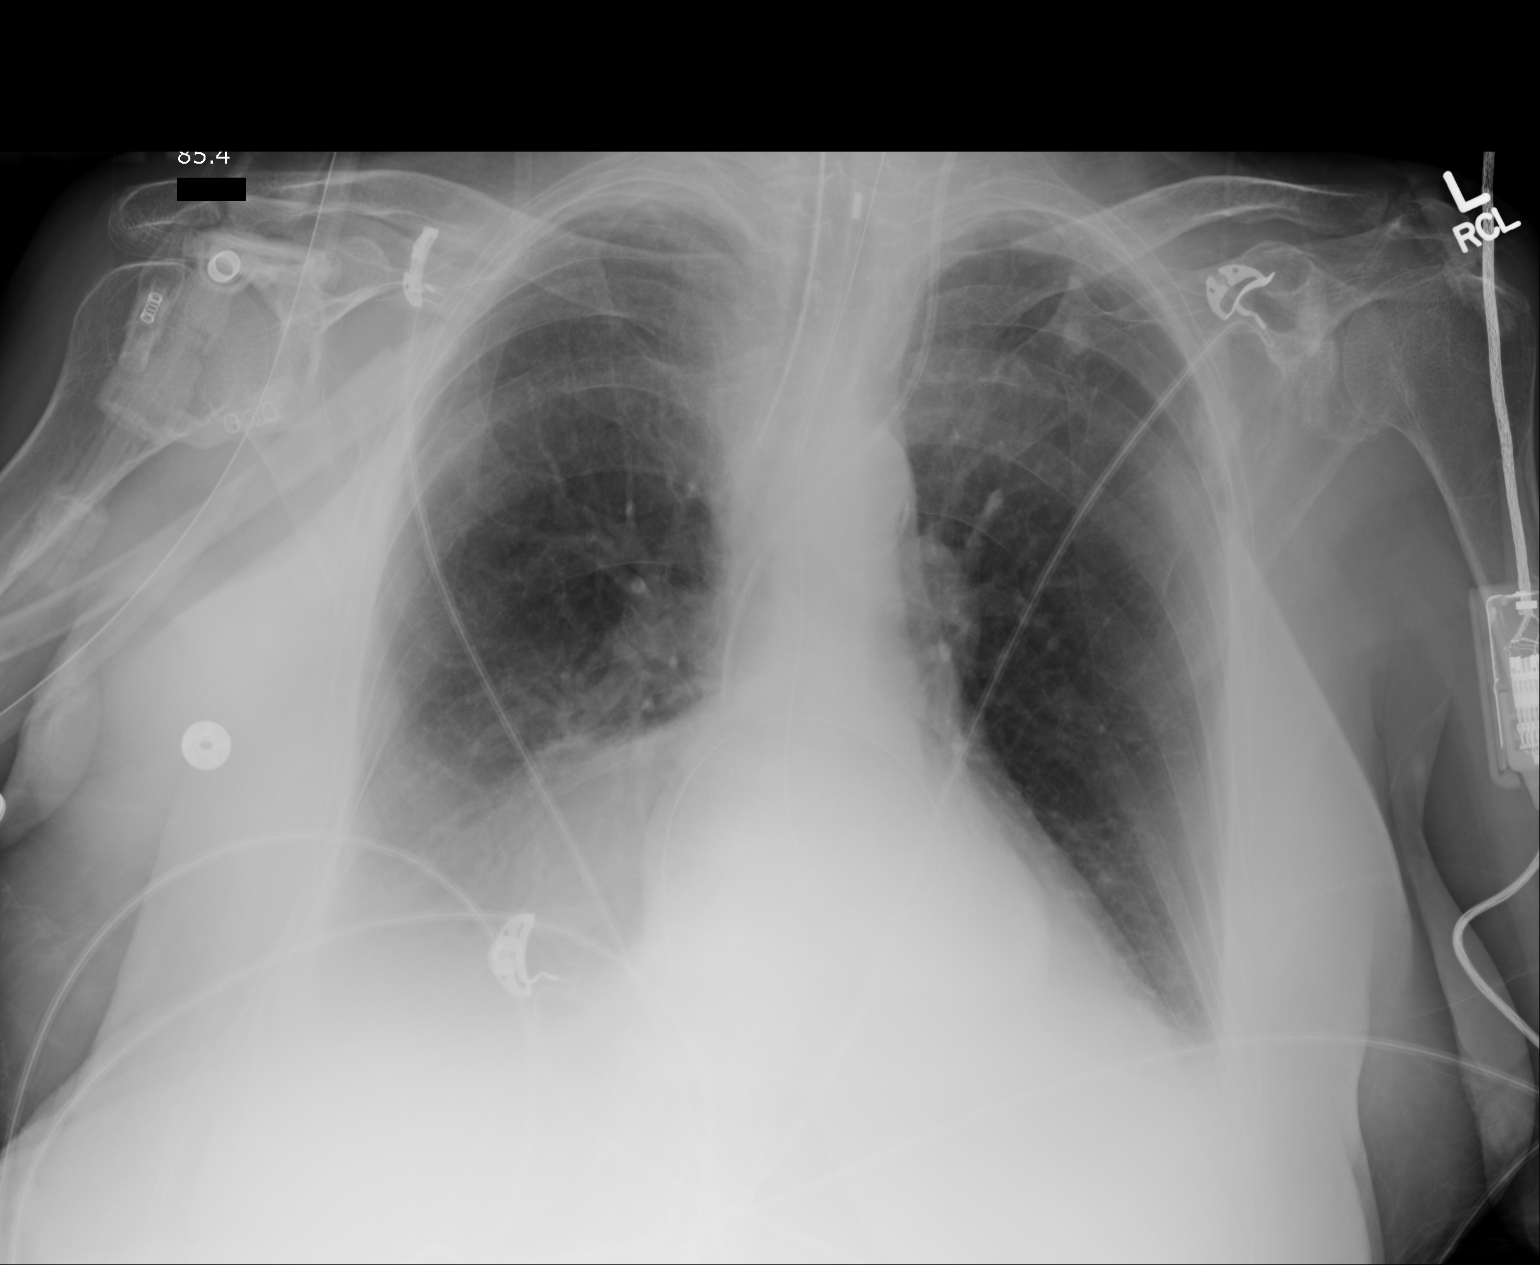

[1 of 1 positions shown; findings below may reference images not displayed]

FINDINGS: Placement of a left jugular central line. Catheter tip in the SVC
region. Again noted is a nasogastric tube coiled in a large hiatal
hernia. Endotracheal tube is 2 cm above the carina. Persistent
densities at the right lung base suggestive for volume loss and
cannot exclude pleural effusion. Upper lungs are clear. Calcified
granuloma in the left upper lung. Heart size is grossly stable.
IMPRESSION: Central line tip in the SVC region.  Negative for pneumothorax.

Support apparatuses as described.

Persistent basilar densities, particularly on the right side.

Large hiatal hernia with a nasogastric tube.
